# Patient Record
Sex: Female | Born: 2000 | Race: White | Hispanic: No | Marital: Single | Smoking: Never smoker
Health system: Southern US, Community
[De-identification: ages and names within clinical notes are randomized; demographics above are authoritative.]

## PROBLEM LIST (undated history)

## (undated) DIAGNOSIS — K219 Gastro-esophageal reflux disease without esophagitis: Secondary | ICD-10-CM

## (undated) DIAGNOSIS — F5000 Anorexia nervosa, unspecified: Secondary | ICD-10-CM

## (undated) DIAGNOSIS — N83209 Unspecified ovarian cyst, unspecified side: Secondary | ICD-10-CM

## (undated) DIAGNOSIS — F32A Depression, unspecified: Secondary | ICD-10-CM

## (undated) DIAGNOSIS — F419 Anxiety disorder, unspecified: Secondary | ICD-10-CM

## (undated) HISTORY — PX: UPPER GASTROINTESTINAL ENDOSCOPY: SHX188

## (undated) HISTORY — DX: Gastro-esophageal reflux disease without esophagitis: K21.9

## (undated) HISTORY — DX: Anxiety disorder, unspecified: F41.9

## (undated) HISTORY — DX: Depression, unspecified: F32.A

---
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
## 2018-07-19 ENCOUNTER — Other Ambulatory Visit: Payer: Self-pay

## 2018-07-19 ENCOUNTER — Encounter (HOSPITAL_BASED_OUTPATIENT_CLINIC_OR_DEPARTMENT_OTHER): Payer: Self-pay | Admitting: *Deleted

## 2018-07-19 NOTE — Progress Notes (Signed)
Your procedure is scheduled on  ____02-12-2018    Report to [facility] AT  5:30 AM   Call this number if you have problems the morning of surgery  :[phone].   OUR ADDRESS IS [address].  WE ARE LOCATED IN [facility].                                     REMEMBER:  No solid food after midnight the night prior to surgery.  Nothing by mouth exception clear liquid diet until 3 hours prior to scheduled surgery time, 4:30 AM.  Please finish ensure pre-surgery carbohydrate drink per your surgeon's order.  Needs to be completed at 4:30AM.  After this time nothing by mouth including candy, gum, mints.  Only sip of water with birth control pill.  __________________________________                                     DO NOT WEAR JEWERLY, MAKE UP, OR NAIL POLISH,   DO NOT WEAR LOTIONS, POWDERS, PERFUMES OR OILS  DO NOT SHAVE FOR 24 HOURS PRIOR TO DAY OF SURGERY.  CONTACTS, GLASSES, OR DENTURES MAY NOT BE WORN TO SURGERY.                                    [facility] IS NOT RESPONSIBLE  FOR ANY BELONGINGS.                                   __________________________________    CLEAR LIQUID DIET   Foods Allowed                                                                     Foods Excluded  Coffee and tea, regular and decaf                             liquids that you cannot  Plain Jell-O in any flavor                                             see through such as: Fruit ices (not with fruit pulp)                                     milk, soups, orange juice  Iced Popsicles                                    All solid food Carbonated beverages, regular and diet  Cranberry, grape and apple juices Sports drinks like Gatorade Lightly seasoned clear broth or consume(fat free) Sugar, honey syrup  _____________________________________________________________________    .

## 2018-07-19 NOTE — Progress Notes (Signed)
Spoke w/ pt via phone for pre-op interview.  Npo after mn w/ exception clear liquids until 0430 at which is to complete ensure pre-surgery drink (pt to pick up when she gets her lab work done ).  Needs urine preg.  Getting cbc and t&s done Thursday 07-22-2018 @ 0900 and picking up drink with instructions printed and given.

## 2018-07-21 NOTE — H&P (Addendum)
Alexis Keller is an 18 y.o. female. Presenting for scheduled LSC ovarian cyst drainage/removal and possible RSO. 7cm ovarian cyst found on OSH imaging in addition to large amount of FF. Patient with RLQ pain that is persistent - ibuprofen and tramadol help. She was switched from loloestrin to 19mcgEE/15mcg levonorgestrel in the office to help prevent future cust formation.  [place] repeat in office and found to have a persistent 6cm cyst and ff.  Pertinent Gynecological History: Menses: flow is moderate Bleeding: normal periods Contraception: OCP (estrogen/progesterone) DES exposure: denies Blood transfusions: none Sexually transmitted diseases: no past history Previous GYN Procedures: none  Last mammogram: N/A Last pap: N/A OB History: G0   Menstrual History: Patient's last menstrual period was 07/13/2018 (exact date).    Past Medical History:  Diagnosis Date  . Anorexia nervosa in remission   . Ovarian cyst    Depression/anxiety - sertraline 100mg  and trazadone prn sleep [facility]  Past Surgical History:  Procedure Laterality Date  . TONSILLECTOMY AND ADENOIDECTOMY  2008    Family History  Problem Relation Age of Onset  . Hypertension Other   . Stroke Other   . Diabetes Other   . Hyperlipidemia Other     Social History:  reports that she has never smoked. She has never used smokeless tobacco. She reports current alcohol use. She reports that she does not use drugs.  Allergies: No Known Allergies  No medications prior to admission.    ROS  Height 5\' 7"  (1.702 m), weight 64.9 kg, last menstrual period 07/13/2018. Physical Exam Gen: well appearing, NAD CV: Reg rate Pulm: NWOB Abd: soft, nondistended, nontender, no masses GYN: uterus normal sized, mild TTP R adnexa, + fullness, no overt CMT Ext: No edema b/l  No results found for this or any previous visit (from the past 24 hour(s)).  No results found.  Assessment/Plan: 18 yo G0 presenting for management of right  sided ovarian cyst. While it is large in size, it is simple appearing and I have a low suspicion for carcinogenic process. Options reviewed in office. She desires surgical management give + pain and >6cm. Plan for [facility] ovarian cystectomy, possible drainage of cyst, possible RSO, possible laparotomy, and any additional procedures.  Risks discussed including infection, bleeding, damage to surrounding structures, need for additional procedures, spreading of malignancy if cyst ruptures and is malignant, postoperative DVT and recurrent cyst formation. All questions answered. Consent signed in clinic.   Hx anxiety/depression c/w sertraline an dtrazadone. Also hx of eating d/o in remission.  On OCPs for [facility] - Plan to continue preop and postop - benefits outweigh risks.   Alexis Keller 07/21/2018, 12:04 PM   No updates to above H&P. Patient arrived NPO and was consented in PACU. Risks again discussed, all questions answered, and consent signed. Proceed with above surgery.    Belva Agee MD

## 2018-07-22 ENCOUNTER — Encounter (HOSPITAL_COMMUNITY)
Admission: RE | Admit: 2018-07-22 | Discharge: 2018-07-22 | Disposition: A | Discharge status: Home / Self Care | Payer: BLUE CROSS/BLUE SHIELD | Source: Ambulatory Visit | Attending: Obstetrics and Gynecology | Admitting: Obstetrics and Gynecology

## 2018-07-22 DIAGNOSIS — Z01812 Encounter for preprocedural laboratory examination: Secondary | ICD-10-CM | POA: Insufficient documentation

## 2018-07-22 LAB — CBC
HEMATOCRIT: 41.5 % (ref 36.0–46.0)
Hemoglobin: 13.5 g/dL (ref 12.0–15.0)
MCH: 28.8 pg (ref 26.0–34.0)
MCHC: 32.5 g/dL (ref 30.0–36.0)
MCV: 88.7 fL (ref 80.0–100.0)
NRBC: 0 % (ref 0.0–0.2)
Platelets: 195 10*3/uL (ref 150–400)
RBC: 4.68 MIL/uL (ref 3.87–5.11)
RDW: 12.3 % (ref 11.5–15.5)
WBC: 3.9 10*3/uL — ABNORMAL LOW (ref 4.0–10.5)

## 2018-07-22 LAB — ABO/RH: ABO/RH(D): O POS

## 2018-07-22 NOTE — Anesthesia Preprocedure Evaluation (Addendum)
Anesthesia Evaluation  Patient identified by MRN, date of birth, ID band Patient awake    Reviewed: Allergy & Precautions, NPO status , Patient's Chart, lab work & pertinent test results  Airway Mallampati: I  TM Distance: >3 FB Neck ROM: Full    Dental no notable dental hx. (+) Teeth Intact, Dental Advisory Given   Pulmonary neg pulmonary ROS,    Pulmonary exam normal breath sounds clear to auscultation       Cardiovascular Exercise Tolerance: Good negative cardio ROS Normal cardiovascular exam Rhythm:Regular Rate:Normal     Neuro/Psych negative neurological ROS     GI/Hepatic   Endo/Other    Renal/GU      Musculoskeletal   Abdominal   Peds  Hematology   Anesthesia Other Findings   Reproductive/Obstetrics negative OB ROS                           Anesthesia Physical Anesthesia Plan  ASA: I  Anesthesia Plan: General   Post-op Pain Management:    Induction: Intravenous  PONV Risk Score and Plan: 3 and Treatment may vary due to age or medical condition, Dexamethasone and Ondansetron  Airway Management Planned: Oral ETT  Additional Equipment:   Intra-op Plan:   Post-operative Plan: Extubation in OR  Informed Consent: I have reviewed the patients History and Physical, chart, labs and discussed the procedure including the risks, benefits and alternatives for the proposed anesthesia with the patient or authorized representative who has indicated his/her understanding and acceptance.     Dental advisory given  Plan Discussed with:   Anesthesia Plan Comments: (Lap ovarian Cystectomy)        Anesthesia Quick Evaluation

## 2018-07-23 ENCOUNTER — Ambulatory Visit (HOSPITAL_BASED_OUTPATIENT_CLINIC_OR_DEPARTMENT_OTHER)
Admission: RE | Admit: 2018-07-23 | Discharge: 2018-07-23 | Disposition: A | Discharge status: Home / Self Care | Payer: BLUE CROSS/BLUE SHIELD | Attending: Obstetrics and Gynecology | Admitting: Obstetrics and Gynecology

## 2018-07-23 ENCOUNTER — Encounter (HOSPITAL_BASED_OUTPATIENT_CLINIC_OR_DEPARTMENT_OTHER)
Admission: RE | Disposition: A | Discharge status: Home / Self Care | Payer: Self-pay | Source: Home / Self Care | Attending: Obstetrics and Gynecology

## 2018-07-23 ENCOUNTER — Ambulatory Visit (HOSPITAL_BASED_OUTPATIENT_CLINIC_OR_DEPARTMENT_OTHER): Payer: BLUE CROSS/BLUE SHIELD | Admitting: Anesthesiology

## 2018-07-23 ENCOUNTER — Encounter (HOSPITAL_BASED_OUTPATIENT_CLINIC_OR_DEPARTMENT_OTHER): Payer: Self-pay

## 2018-07-23 DIAGNOSIS — Z793 Long term (current) use of hormonal contraceptives: Secondary | ICD-10-CM | POA: Diagnosis not present

## 2018-07-23 DIAGNOSIS — N8301 Follicular cyst of right ovary: Secondary | ICD-10-CM | POA: Insufficient documentation

## 2018-07-23 DIAGNOSIS — F419 Anxiety disorder, unspecified: Secondary | ICD-10-CM | POA: Diagnosis not present

## 2018-07-23 DIAGNOSIS — F329 Major depressive disorder, single episode, unspecified: Secondary | ICD-10-CM | POA: Diagnosis not present

## 2018-07-23 DIAGNOSIS — Z79899 Other long term (current) drug therapy: Secondary | ICD-10-CM | POA: Insufficient documentation

## 2018-07-23 DIAGNOSIS — N83201 Unspecified ovarian cyst, right side: Secondary | ICD-10-CM | POA: Diagnosis present

## 2018-07-23 HISTORY — DX: Unspecified ovarian cyst, unspecified side: N83.209

## 2018-07-23 HISTORY — PX: LAPAROSCOPIC OVARIAN CYSTECTOMY: SHX6248

## 2018-07-23 HISTORY — DX: Anorexia nervosa, unspecified: F50.00

## 2018-07-23 LAB — TYPE AND SCREEN
ABO/RH(D): O POS
Antibody Screen: NEGATIVE

## 2018-07-23 LAB — POCT PREGNANCY, URINE: Preg Test, Ur: NEGATIVE

## 2018-07-23 SURGERY — EXCISION, CYST, OVARY, LAPAROSCOPIC
Anesthesia: General | Site: Abdomen | Laterality: Right

## 2018-07-23 MED ORDER — ONDANSETRON HCL 4 MG/2ML IJ SOLN
INTRAMUSCULAR | Status: AC
  Filled 2018-07-23: qty 2

## 2018-07-23 MED ORDER — SCOPOLAMINE 1 MG/3DAYS TD PT72
MEDICATED_PATCH | TRANSDERMAL | Status: AC
  Filled 2018-07-23: qty 1

## 2018-07-23 MED ORDER — TRAMADOL HCL 50 MG PO TABS: 50.0000 mg | ORAL_TABLET | Freq: Four times a day (QID) | ORAL | 0 refills | Status: AC | PRN

## 2018-07-23 MED ORDER — LACTATED RINGERS IV SOLN
INTRAVENOUS | Status: DC
  Administered 2018-07-23 (×2): via INTRAVENOUS
  Filled 2018-07-23: qty 1000

## 2018-07-23 MED ORDER — LIDOCAINE 2% (20 MG/ML) 5 ML SYRINGE
INTRAMUSCULAR | Status: DC | PRN
  Administered 2018-07-23: 100 mg via INTRAVENOUS

## 2018-07-23 MED ORDER — ACETAMINOPHEN 500 MG PO TABS
1000.0000 mg | ORAL_TABLET | Freq: Once | ORAL | Status: AC
  Administered 2018-07-23: 1000 mg via ORAL
  Filled 2018-07-23: qty 2

## 2018-07-23 MED ORDER — SUGAMMADEX SODIUM 200 MG/2ML IV SOLN
INTRAVENOUS | Status: AC
  Filled 2018-07-23: qty 2

## 2018-07-23 MED ORDER — FENTANYL CITRATE (PF) 100 MCG/2ML IJ SOLN
INTRAMUSCULAR | Status: DC | PRN
  Administered 2018-07-23: 25 ug via INTRAVENOUS
  Administered 2018-07-23 (×2): 50 ug via INTRAVENOUS
  Administered 2018-07-23 (×3): 25 ug via INTRAVENOUS

## 2018-07-23 MED ORDER — GLYCOPYRROLATE PF 0.2 MG/ML IJ SOSY
PREFILLED_SYRINGE | INTRAMUSCULAR | Status: AC
  Filled 2018-07-23: qty 1

## 2018-07-23 MED ORDER — MIDAZOLAM HCL 2 MG/2ML IJ SOLN
INTRAMUSCULAR | Status: AC
  Filled 2018-07-23: qty 2

## 2018-07-23 MED ORDER — OXYCODONE HCL 5 MG/5ML PO SOLN
5.0000 mg | Freq: Once | ORAL | Status: AC | PRN
  Filled 2018-07-23: qty 5

## 2018-07-23 MED ORDER — ROCURONIUM BROMIDE 100 MG/10ML IV SOLN
INTRAVENOUS | Status: AC
  Filled 2018-07-23: qty 1

## 2018-07-23 MED ORDER — PROPOFOL 10 MG/ML IV BOLUS
INTRAVENOUS | Status: AC
  Filled 2018-07-23: qty 20

## 2018-07-23 MED ORDER — GLYCOPYRROLATE PF 0.2 MG/ML IJ SOSY
PREFILLED_SYRINGE | INTRAMUSCULAR | Status: DC | PRN
  Administered 2018-07-23: .2 mg via INTRAVENOUS

## 2018-07-23 MED ORDER — GABAPENTIN 300 MG PO CAPS
ORAL_CAPSULE | ORAL | Status: AC
  Filled 2018-07-23: qty 1

## 2018-07-23 MED ORDER — BUPIVACAINE HCL (PF) 0.25 % IJ SOLN
INTRAMUSCULAR | Status: DC | PRN
  Administered 2018-07-23: 10 mL

## 2018-07-23 MED ORDER — ONDANSETRON HCL 4 MG/2ML IJ SOLN
4.0000 mg | Freq: Once | INTRAMUSCULAR | Status: DC | PRN
  Filled 2018-07-23: qty 2

## 2018-07-23 MED ORDER — DEXAMETHASONE SODIUM PHOSPHATE 10 MG/ML IJ SOLN
INTRAMUSCULAR | Status: DC | PRN
  Administered 2018-07-23: 5 mg via INTRAVENOUS

## 2018-07-23 MED ORDER — CELECOXIB 200 MG PO CAPS
ORAL_CAPSULE | ORAL | Status: AC
  Filled 2018-07-23: qty 2

## 2018-07-23 MED ORDER — MIDAZOLAM HCL 2 MG/2ML IJ SOLN
INTRAMUSCULAR | Status: DC | PRN
  Administered 2018-07-23: 2 mg via INTRAVENOUS

## 2018-07-23 MED ORDER — PROPOFOL 10 MG/ML IV BOLUS
INTRAVENOUS | Status: DC | PRN
  Administered 2018-07-23: 160 mg via INTRAVENOUS

## 2018-07-23 MED ORDER — ACETAMINOPHEN 500 MG PO TABS
ORAL_TABLET | ORAL | Status: AC
  Filled 2018-07-23: qty 2

## 2018-07-23 MED ORDER — SODIUM CHLORIDE 0.9 % IR SOLN
Status: DC | PRN
  Administered 2018-07-23: 1

## 2018-07-23 MED ORDER — OXYCODONE HCL 5 MG PO TABS
5.0000 mg | ORAL_TABLET | Freq: Once | ORAL | Status: AC | PRN
  Administered 2018-07-23: 5 mg via ORAL
  Filled 2018-07-23: qty 1

## 2018-07-23 MED ORDER — FENTANYL CITRATE (PF) 100 MCG/2ML IJ SOLN
INTRAMUSCULAR | Status: AC
  Filled 2018-07-23: qty 2

## 2018-07-23 MED ORDER — OXYCODONE HCL 5 MG PO TABS
ORAL_TABLET | ORAL | Status: AC
  Filled 2018-07-23: qty 1

## 2018-07-23 MED ORDER — HYDROMORPHONE HCL 1 MG/ML IJ SOLN
0.2500 mg | INTRAMUSCULAR | Status: DC | PRN
  Filled 2018-07-23: qty 0.5

## 2018-07-23 MED ORDER — DEXAMETHASONE SODIUM PHOSPHATE 10 MG/ML IJ SOLN
INTRAMUSCULAR | Status: AC
  Filled 2018-07-23: qty 1

## 2018-07-23 MED ORDER — KETOROLAC TROMETHAMINE 30 MG/ML IJ SOLN
30.0000 mg | Freq: Once | INTRAMUSCULAR | Status: DC | PRN
  Filled 2018-07-23: qty 1

## 2018-07-23 MED ORDER — SCOPOLAMINE 1 MG/3DAYS TD PT72
1.0000 | MEDICATED_PATCH | TRANSDERMAL | Status: DC
  Administered 2018-07-23: 1.5 mg via TRANSDERMAL
  Filled 2018-07-23: qty 1

## 2018-07-23 MED ORDER — MEPERIDINE HCL 25 MG/ML IJ SOLN
6.2500 mg | INTRAMUSCULAR | Status: DC | PRN
  Filled 2018-07-23: qty 1

## 2018-07-23 MED ORDER — GABAPENTIN 300 MG PO CAPS
300.0000 mg | ORAL_CAPSULE | Freq: Once | ORAL | Status: AC
  Administered 2018-07-23: 300 mg via ORAL
  Filled 2018-07-23: qty 1

## 2018-07-23 MED ORDER — ONDANSETRON HCL 4 MG/2ML IJ SOLN
INTRAMUSCULAR | Status: DC | PRN
  Administered 2018-07-23: 4 mg via INTRAVENOUS

## 2018-07-23 MED ORDER — LIDOCAINE 2% (20 MG/ML) 5 ML SYRINGE
INTRAMUSCULAR | Status: AC
  Filled 2018-07-23: qty 5

## 2018-07-23 MED ORDER — CELECOXIB 400 MG PO CAPS
400.0000 mg | ORAL_CAPSULE | Freq: Once | ORAL | Status: AC
  Administered 2018-07-23: 400 mg via ORAL
  Filled 2018-07-23: qty 1

## 2018-07-23 MED ORDER — SUGAMMADEX SODIUM 200 MG/2ML IV SOLN
INTRAVENOUS | Status: DC | PRN
  Administered 2018-07-23: 130 mg via INTRAVENOUS

## 2018-07-23 MED ORDER — ROCURONIUM BROMIDE 10 MG/ML (PF) SYRINGE
PREFILLED_SYRINGE | INTRAVENOUS | Status: DC | PRN
  Administered 2018-07-23: 35 mg via INTRAVENOUS
  Administered 2018-07-23: 15 mg via INTRAVENOUS

## 2018-07-23 MED ORDER — IBUPROFEN 800 MG PO TABS: 800.0000 mg | ORAL_TABLET | Freq: Three times a day (TID) | ORAL | 0 refills | Status: AC | PRN

## 2018-07-23 MED ORDER — KETOROLAC TROMETHAMINE 30 MG/ML IJ SOLN
INTRAMUSCULAR | Status: AC
  Filled 2018-07-23: qty 1

## 2018-07-23 SURGICAL SUPPLY — 35 items
CABLE HIGH FREQUENCY MONO STRZ (ELECTRODE) IMPLANT
CATH ROBINSON RED A/P 16FR (CATHETERS) ×2 IMPLANT
COVER MAYO STAND STRL (DRAPES) ×2 IMPLANT
COVER WAND RF STERILE (DRAPES) ×2 IMPLANT
DERMABOND ADVANCED (GAUZE/BANDAGES/DRESSINGS) ×1
DERMABOND ADVANCED .7 DNX12 (GAUZE/BANDAGES/DRESSINGS) ×1 IMPLANT
DRSG OPSITE POSTOP 3X4 (GAUZE/BANDAGES/DRESSINGS) IMPLANT
DURAPREP 26ML APPLICATOR (WOUND CARE) ×2 IMPLANT
GAUZE 4X4 16PLY RFD (DISPOSABLE) ×2 IMPLANT
GLOVE BIO SURGEON STRL SZ 6.5 (GLOVE) ×2 IMPLANT
GLOVE BIOGEL PI IND STRL 6.5 (GLOVE) ×1 IMPLANT
GLOVE BIOGEL PI INDICATOR 6.5 (GLOVE) ×1
GOWN STRL REUS W/TWL LRG LVL3 (GOWN DISPOSABLE) ×4 IMPLANT
IV NS 1000ML (IV SOLUTION)
IV NS 1000ML BAXH (IV SOLUTION) IMPLANT
LIGASURE BLUNT 5MM 37CM (INSTRUMENTS) IMPLANT
NDL HYPO 25X1 1.5 SAFETY (NEEDLE) ×1 IMPLANT
NEEDLE HYPO 25X1 1.5 SAFETY (NEEDLE) ×2 IMPLANT
NEEDLE INSUFFLATION 120MM (ENDOMECHANICALS) ×2 IMPLANT
NS IRRIG 500ML POUR BTL (IV SOLUTION) ×2 IMPLANT
PACK LAPAROSCOPY BASIN (CUSTOM PROCEDURE TRAY) ×2 IMPLANT
SCISSORS LAP 5X35 DISP (ENDOMECHANICALS) IMPLANT
SCISSORS LAP 5X45 EPIX DISP (ENDOMECHANICALS) IMPLANT
SET IRRIG TUBING LAPAROSCOPIC (IRRIGATION / IRRIGATOR) IMPLANT
SUT VICRYL 0 UR6 27IN ABS (SUTURE) ×2 IMPLANT
SUT VICRYL RAPIDE 4/0 PS 2 (SUTURE) ×4 IMPLANT
SYR 50ML LL SCALE MARK (SYRINGE) IMPLANT
SYS BAG RETRIEVAL 10MM (BASKET)
SYSTEM BAG RETRIEVAL 10MM (BASKET) IMPLANT
TOWEL OR 17X24 6PK STRL BLUE (TOWEL DISPOSABLE) ×4 IMPLANT
TROCAR XCEL NON-BLD 11X100MML (ENDOMECHANICALS) IMPLANT
TROCAR XCEL NON-BLD 5MMX100MML (ENDOMECHANICALS) ×2 IMPLANT
TUBING INSUF HEATED (TUBING) ×2 IMPLANT
WARMER LAPAROSCOPE (MISCELLANEOUS) ×2 IMPLANT
WATER STERILE IRR 500ML POUR (IV SOLUTION) ×2 IMPLANT

## 2018-07-23 NOTE — Anesthesia Postprocedure Evaluation (Signed)
Anesthesia Post Note  Patient: Alexis Keller  Procedure(s) Performed: LAPAROSCOPIC OVARIAN CYSTECTOMY (Right Abdomen)     Patient location during evaluation: PACU Anesthesia Type: General Level of consciousness: awake and alert Pain management: pain level controlled Vital Signs Assessment: post-procedure vital signs reviewed and stable Respiratory status: spontaneous breathing, nonlabored ventilation, respiratory function stable and patient connected to nasal cannula oxygen Cardiovascular status: blood pressure returned to baseline and stable Postop Assessment: no apparent nausea or vomiting Anesthetic complications: no    Last Vitals:  Vitals:   07/23/18 0900 07/23/18 0915  BP: 129/72 115/69  Pulse: 63 72  Resp: 10 (!) 21  Temp:    SpO2: 96% 97%    Last Pain:  Vitals:   07/23/18 0915  TempSrc:   PainSc: Asleep                 Trevor Iha

## 2018-07-23 NOTE — Brief Op Note (Signed)
07/23/2018  9:56 AM  PATIENT:  Alexis Keller  18 y.o. female  PRE-OPERATIVE DIAGNOSIS:  7cm RIGHT  ovarian cyst  POST-OPERATIVE DIAGNOSIS:  7cm RIGHT  ovarian cyst  PROCEDURE:  Procedure(s): LAPAROSCOPIC OVARIAN CYSTECTOMY (Right)  SURGEON:  Surgeon(s) and Role:    * Garyn Arlotta, Madelaine Etienne, MD - Primary  PHYSICIAN ASSISTANT:   ASSISTANTS: none   ANESTHESIA:   local and general  EBL:  20 mL   BLOOD ADMINISTERED:none  DRAINS: none   LOCAL MEDICATIONS USED:  LIDOCAINE  and Amount: 10 ml  SPECIMEN:  Source of Specimen:  ovarian cyst and pelvic washings  DISPOSITION OF SPECIMEN:  PATHOLOGY  COUNTS:  YES  TOURNIQUET:  * No tourniquets in log *  DICTATION: .Note written in EPIC  PLAN OF CARE: Discharge to home after PACU  PATIENT DISPOSITION:  PACU - hemodynamically stable.   Delay start of Pharmacological VTE agent (>24hrs) due to surgical blood loss or risk of bleeding: not applicable

## 2018-07-23 NOTE — Transfer of Care (Signed)
Immediate Anesthesia Transfer of Care Note  Patient: Alexis Keller  Procedure(s) Performed: Procedure(s) (LRB): LAPAROSCOPIC OVARIAN CYSTECTOMY (Right)  Patient Location: PACU  Anesthesia Type: General  Level of Consciousness: awake, oriented, sedated and patient cooperative  Airway & Oxygen Therapy: Patient Spontanous Breathing and Patient connected to face mask oxygen  Post-op Assessment: Report given to PACU RN and Post -op Vital signs reviewed and stable  Post vital signs: Reviewed and stable  Complications: No apparent anesthesia complications Last Vitals:  Vitals Value Taken Time  BP    Temp    Pulse 91 07/23/2018  8:51 AM  Resp    SpO2 97 % 07/23/2018  8:51 AM  Vitals shown include unvalidated device data.  Last Pain:  Vitals:   07/23/18 0601  TempSrc:   PainSc: 6       Patients Stated Pain Goal: 5 (07/23/18 0601)

## 2018-07-23 NOTE — Anesthesia Procedure Notes (Signed)
Procedure Name: Intubation Date/Time: 07/23/2018 7:24 AM Performed by: Suan Halter, CRNA Pre-anesthesia Checklist: Patient identified, Emergency Drugs available, Suction available and Patient being monitored Patient Re-evaluated:Patient Re-evaluated prior to induction Oxygen Delivery Method: Circle system utilized Preoxygenation: Pre-oxygenation with 100% oxygen Induction Type: IV induction Ventilation: Mask ventilation without difficulty Laryngoscope Size: Mac and 3 Grade View: Grade I Tube type: Oral Tube size: 7.0 mm Number of attempts: 1 Airway Equipment and Method: Stylet and Oral airway Placement Confirmation: ETT inserted through vocal cords under direct vision,  positive ETCO2 and breath sounds checked- equal and bilateral Secured at: 22 cm Tube secured with: Tape Dental Injury: Teeth and Oropharynx as per pre-operative assessment

## 2018-07-23 NOTE — Discharge Instructions (Signed)
DISCHARGE INSTRUCTIONS: Laparoscopy  The following instructions have been prepared to help you care for yourself upon your return home today.  Wound care: . Do not get the incision wet for the first 24 hours. The incision should be kept clean and dry. . The Band-Aids or dressings may be removed the day after surgery. . Should the incision become sore, red, and swollen after the first week, check with your doctor.  Personal hygiene: . Shower the day after your procedure.  Activity and limitations: . Do NOT drive or operate any equipment today. . Do NOT lift anything more than 15 pounds for 2-3 weeks after surgery. . Do NOT rest in bed all day. . Walking is encouraged. Walk each day, starting slowly with 5-minute walks 3 or 4 times a day. Slowly increase the length of your walks. . Walk up and down stairs slowly. . Do NOT do strenuous activities, such as golfing, playing tennis, bowling, running, biking, weight lifting, gardening, mowing, or vacuuming for 2-4 weeks. Ask your doctor when it is okay to start.  Diet: Eat a light meal as desired this evening. You may resume your usual diet tomorrow.  Return to work: This is dependent on the type of work you do. For the most part you can return to a desk job within a week of surgery. If you are more active at work, please discuss this with your doctor.  What to expect after your surgery: You may have a slight burning sensation when you urinate on the first day. You may have a very small amount of blood in the urine. Expect to have a small amount of vaginal discharge/light bleeding for 1-2 weeks. It is not unusual to have abdominal soreness and bruising for up to 2 weeks. You may be tired and need more rest for about 1 week. You may experience shoulder pain for 24-72 hours. Lying flat in bed may relieve it.  Call your doctor for any of the following: . Develop a fever of 100.4 or greater . Inability to urinate 6 hours after discharge from  hospital . Severe pain not relieved by pain medications . Persistent of heavy bleeding at incision site . Redness or swelling around incision site after a week . Increasing nausea or vomiting   Post Anesthesia Home Care Instructions  Activity: Get plenty of rest for the remainder of the day. A responsible adult should stay with you for 24 hours following the procedure.  For the next 24 hours, DO NOT: -Drive a car -Operate machinery -Drink alcoholic beverages -Take any medication unless instructed by your physician -Make any legal decisions or sign important papers.  Meals: Start with liquid foods such as gelatin or soup. Progress to regular foods as tolerated. Avoid greasy, spicy, heavy foods. If nausea and/or vomiting occur, drink only clear liquids until the nausea and/or vomiting subsides. Call your physician if vomiting continues.  Special Instructions/Symptoms: Your throat may feel dry or sore from the anesthesia or the breathing tube placed in your throat during surgery. If this causes discomfort, gargle with warm salt water. The discomfort should disappear within 24 hours.  If you had a scopolamine patch placed behind your ear for the management of post- operative nausea and/or vomiting:  1. The medication in the patch is effective for 72 hours, after which it should be removed.  Wrap patch in a tissue and discard in the trash. Wash hands thoroughly with soap and water. 2. You may remove the patch earlier than 72   hours if you experience unpleasant side effects which may include dry mouth, dizziness or visual disturbances. 3. Avoid touching the patch. Wash your hands with soap and water after contact with the patch.    

## 2018-07-23 NOTE — Op Note (Signed)
DATE OF OPERATION:  07/23/18  PREOPERATIVE DIAGNOSES: 1. Right ovarian cyst  POSTOPERATIVE DIAGNOSIS: 1. Same  OPERATION PERFORMED: Laparoscopic right sided ovarian cystectomy and pelvic washings  SURGEON:  Belva Agee, MD  ANESTHESIA:  General.  OPERATIVE FINDINGS: 1.  Bimanual examination revealed fullness in right adnexa and normal uterus 2. Operative findings showed   Normal uterus and bilateral fallopian tubes  Normal left ovary  Right ovary with large ~7cm ovarian cyst  Small amount of ff in culdesac  SPECIMEN SENT: Right ovarian cyst wall Pelvic washings  ESTIMATED BLOOD LOSS: 20 mL.  IV FLUIDS and URINE OUTPUT: See Anesthesia record   INDICATIONS FOR OPERATION: The patient is a 18 yo w/h/o R sided ovarian cyst (7cm) on [place] and ff with abdominal pain. All risks and benefits of the surgery had been discussed with the patient in the office and again on day of surgery. All her questions were answered. The patient was taken to the operating room in stable condition.  DESCRIPTION OF OPERATION: The patient was taken to the OR where she was placed under general anesthesia without difficulty. She was then prepped and draped in the usual sterile fashion and placed in the dorsal lithotomy position. A preoperative bimanual examination revealed findings as above. Attention was turned to the vagina, where a speculum was placed in the vagina and the anterior lip of the cervix was grasped using a single-toothed tenaculum. The manipulator was then inserted through the cervix without difficulty and the weighted speculum was removed. The bladder had been drained with a  Catheter. Attention was then turned to the abdomen where an approximately 11 mm skin incision was made at the base of the umbilicus. Entry into the peritoneum and insufflation was performed with a Veress needle to 3 liters of Co2 gas, drawing back on a syringe to ensure no blood or bowel contents were aspirated. Veress was  removed. At this time, the 10 mm trocar was placed through the umbilical incision and confirmation of intraabdominal placement was confirmed under direct visualization by the camera. At this time, the second incision was made using a 5 mm incision and 5 mm trocar 3cm above the pubic symphysis. Examination of the pelvis revealed findings as above. Pelvic washings were taken. Attention was turned to the right ovary which was mobilized out of the pelvis. A cyst was noted. An approximately 3 cm incision was made along the ovary using electrocautery. At this point, it was dissected initially using blunt dissection. Entry into the cyst was created using the electrocautery. Suction was used to fluid at this time from the cyst. At this time, the cyst wall was identified and was removed using blunt dissection with countertraction. Pieces of cyst wall were removed through the 73mm port.The ovarian bed was then irrigated and dried. There was slight oozing noted near the edge of the ovary, obtained hemostasis using electrocautery. The pelvis was then irrigated and hemostasis was achieved. Hemostasis was assured. At this time, all instruments were removed under visualization at low pressure. The 10 mm umbilical incision was closed using  One 0' Vicryl sutures and the skin was closed using a running stitch of 4-0 vicryl suture. 77mm port was closed with 4'0 and dermabond.  Attention was then turned to vagina were a the manipulator was removed. No bleeding from cervical os or tenaculum site. Once all instruments were removed, the patient was cleaned. The patient tolerated the procedure well without complications and was taken to the recovery room in stable condition.  Belva Agee MD

## 2018-07-26 ENCOUNTER — Encounter (HOSPITAL_BASED_OUTPATIENT_CLINIC_OR_DEPARTMENT_OTHER): Payer: Self-pay | Admitting: Obstetrics and Gynecology

## 2019-04-28 ENCOUNTER — Emergency Department (HOSPITAL_COMMUNITY)
Admission: EM | Admit: 2019-04-28 | Discharge: 2019-04-28 | Disposition: A | Discharge status: Home / Self Care | Payer: BC Managed Care – PPO | Attending: Emergency Medicine | Admitting: Emergency Medicine

## 2019-04-28 ENCOUNTER — Other Ambulatory Visit: Payer: Self-pay

## 2019-04-28 ENCOUNTER — Encounter (HOSPITAL_COMMUNITY): Payer: Self-pay | Admitting: Emergency Medicine

## 2019-04-28 ENCOUNTER — Emergency Department (HOSPITAL_COMMUNITY): Payer: BC Managed Care – PPO

## 2019-04-28 DIAGNOSIS — R1031 Right lower quadrant pain: Secondary | ICD-10-CM

## 2019-04-28 DIAGNOSIS — Z79899 Other long term (current) drug therapy: Secondary | ICD-10-CM | POA: Insufficient documentation

## 2019-04-28 LAB — CBC
HCT: 46.2 % — ABNORMAL HIGH (ref 36.0–46.0)
Hemoglobin: 15.2 g/dL — ABNORMAL HIGH (ref 12.0–15.0)
MCH: 28.9 pg (ref 26.0–34.0)
MCHC: 32.9 g/dL (ref 30.0–36.0)
MCV: 87.8 fL (ref 80.0–100.0)
Platelets: 248 10*3/uL (ref 150–400)
RBC: 5.26 MIL/uL — ABNORMAL HIGH (ref 3.87–5.11)
RDW: 12.6 % (ref 11.5–15.5)
WBC: 4.3 10*3/uL (ref 4.0–10.5)
nRBC: 0 % (ref 0.0–0.2)

## 2019-04-28 LAB — COMPREHENSIVE METABOLIC PANEL
ALT: 13 U/L (ref 0–44)
AST: 17 U/L (ref 15–41)
Albumin: 4.5 g/dL (ref 3.5–5.0)
Alkaline Phosphatase: 43 U/L (ref 38–126)
Anion gap: 11 (ref 5–15)
BUN: 9 mg/dL (ref 6–20)
CO2: 24 mmol/L (ref 22–32)
Calcium: 9.9 mg/dL (ref 8.9–10.3)
Chloride: 104 mmol/L (ref 98–111)
Creatinine, Ser: 0.67 mg/dL (ref 0.44–1.00)
GFR calc Af Amer: >60 mL/min (ref >60)
GFR calc non Af Amer: >60 mL/min (ref >60)
Glucose, Bld: 101 mg/dL — ABNORMAL HIGH (ref 70–99)
Potassium: 3.6 mmol/L (ref 3.5–5.1)
Sodium: 139 mmol/L (ref 135–145)
Total Bilirubin: 0.6 mg/dL (ref 0.3–1.2)
Total Protein: 7.4 g/dL (ref 6.5–8.1)

## 2019-04-28 LAB — URINALYSIS, ROUTINE W REFLEX MICROSCOPIC
Bilirubin Urine: NEGATIVE
Glucose, UA: NEGATIVE mg/dL
Hgb urine dipstick: NEGATIVE
Ketones, ur: NEGATIVE mg/dL
Leukocytes,Ua: NEGATIVE
Nitrite: NEGATIVE
Protein, ur: NEGATIVE mg/dL
Specific Gravity, Urine: 1.018 (ref 1.005–1.030)
pH: 7 (ref 5.0–8.0)

## 2019-04-28 LAB — I-STAT BETA HCG BLOOD, ED (MC, WL, AP ONLY): I-stat hCG, quantitative: <5 m[IU]/mL (ref <5)

## 2019-04-28 LAB — LIPASE, BLOOD: Lipase: 30 U/L (ref 11–51)

## 2019-04-28 MED ORDER — SODIUM CHLORIDE 0.9% FLUSH: 3.0000 mL | Freq: Once | INTRAVENOUS | Status: DC

## 2019-04-28 MED ORDER — DICYCLOMINE HCL 20 MG PO TABS: 20.0000 mg | ORAL_TABLET | Freq: Two times a day (BID) | ORAL | 0 refills | Status: DC

## 2019-04-28 MED ORDER — IOHEXOL 300 MG/ML  SOLN
100.0000 mL | Freq: Once | INTRAMUSCULAR | Status: AC | PRN
  Administered 2019-04-28: 100 mL via INTRAVENOUS

## 2019-04-28 NOTE — Discharge Instructions (Addendum)
As discussed, your evaluation today has been largely reassuring.  But, it is important that you monitor your condition carefully, and do not hesitate to return to the ED if you develop new, or concerning changes in your condition.  Otherwise, please follow-up with your physician for appropriate ongoing care.   Sorry for the delay in discharge

## 2019-04-28 NOTE — ED Notes (Signed)
Patient transported to CT 

## 2019-04-28 NOTE — ED Triage Notes (Signed)
Pt endorses RLQ abd pain for about a week that has worsened. Seen by OBGYN and cleared there. Endorses nausea.

## 2019-04-28 NOTE — ED Provider Notes (Signed)
[facility] EMERGENCY DEPARTMENT Provider Note   CSN: [id number] Arrival date & time: 04/28/19  1451     History   Chief Complaint Chief Complaint  Patient presents with  . Abdominal Pain    HPI Alexis Keller is a 18 y.o. female.     HPI Patient presents with her mother who assists with the HPI. Patient has a history of obstetric issues going back some time, including ovarian cyst surgery earlier this year. She notes that since about that time she has had intermittent discomfort, but now over the past days she has had worsening pain, primarily in the right lower quadrant. There is new, increasing anorexia, mild nausea. No fever.  The pain itself is sore, moderate, nonradiating. No new vaginal complaints, and the patient saw her gynecologist within the past 4 days, had unremarkable exam according to her and her mother, including ultrasound. With new right-sided lower abdominal pain, anorexia, she presents for evaluation.  Past Medical History:  Diagnosis Date  . Anorexia nervosa in remission   . Ovarian cyst     There are no active problems to display for this patient.   Past Surgical History:  Procedure Laterality Date  . LAPAROSCOPIC OVARIAN CYSTECTOMY Right 07/23/2018   Procedure: LAPAROSCOPIC OVARIAN CYSTECTOMY;  Surgeon: Ranae Pila, MD;  Location: [facility];  Service: Gynecology;  Laterality: Right;  . TONSILLECTOMY AND ADENOIDECTOMY  2008     OB History   No obstetric history on file.      Home Medications    Prior to Admission medications   Medication Sig Start Date End Date Taking? Authorizing Provider  Bioflavonoid Products (VITAMIN C) CHEW Chew 2 tablets by mouth daily.   Yes [provider]  hydrOXYzine (VISTARIL) 25 MG capsule Take 25-50 mg by mouth daily as needed for anxiety.  11/30/18  Yes [provider]  ibuprofen (ADVIL,MOTRIN) 800 MG tablet Take 1 tablet (800 mg total) by  mouth every 8 (eight) hours as needed. Patient taking differently: Take 800 mg by mouth daily.  07/23/18  Yes Ranae Pila, MD  LILLOW 0.15-30 MG-MCG tablet Take 1 tablet by mouth daily. 03/02/19  Yes [provider]  sertraline (ZOLOFT) 100 MG tablet Take 150 mg by mouth at bedtime. Pt takes one and one half tablets to = 150 mg   Yes [provider]  traZODone (DESYREL) 50 MG tablet Take 50 mg by mouth at bedtime.   Yes [provider]  dicyclomine (BENTYL) 20 MG tablet Take 1 tablet (20 mg total) by mouth 2 (two) times daily. 04/28/19   Gerhard Munch, MD    Family History Family History  Problem Relation Age of Onset  . Hypertension Other   . Stroke Other   . Diabetes Other   . Hyperlipidemia Other     Social History Social History   Tobacco Use  . Smoking status: Never Smoker  . Smokeless tobacco: Never Used  Substance Use Topics  . Alcohol use: Yes    Comment: occasional  . Drug use: Never     Allergies   Patient has no known allergies.   Review of Systems Review of Systems  Constitutional:       Per HPI, otherwise negative  HENT:       Per HPI, otherwise negative  Respiratory:       Per HPI, otherwise negative  Cardiovascular:       Per HPI, otherwise negative  Gastrointestinal: Negative for vomiting.  Inconsistent bowel movements  Endocrine:       Negative aside from HPI  Genitourinary:       Neg aside from HPI   Musculoskeletal:       Per HPI, otherwise negative  Skin: Negative.   Neurological: Negative for syncope.     Physical Exam Updated Vital Signs BP (!) 139/97   Pulse 90   Temp 98.8 F (37.1 C) (Oral)   Resp 16   Ht 5\' 7"  (1.702 m)   Wt 61.7 kg   LMP  (LMP Unknown)   SpO2 100%   BMI 21.30 kg/m   Physical Exam Vitals signs and nursing note reviewed.  Constitutional:      General: She is not in acute distress.    Appearance: She is well-developed.  HENT:     Head: Normocephalic and  atraumatic.  Eyes:     Conjunctiva/sclera: Conjunctivae normal.  Cardiovascular:     Rate and Rhythm: Normal rate and regular rhythm.  Pulmonary:     Effort: Pulmonary effort is normal. No respiratory distress.     Breath sounds: Normal breath sounds. No stridor.  Abdominal:     General: There is no distension.     Tenderness: There is abdominal tenderness in the right lower quadrant.  Skin:    General: Skin is warm and dry.  Neurological:     Mental Status: She is alert and oriented to person, place, and time.     Cranial Nerves: No cranial nerve deficit.      ED Treatments / Results  Labs (all labs ordered are listed, but only abnormal results are displayed) Labs Reviewed  COMPREHENSIVE METABOLIC PANEL - Abnormal; Notable for the following components:      Result Value   Glucose, Bld 101 (*)    All other components within normal limits  CBC - Abnormal; Notable for the following components:   RBC 5.26 (*)    Hemoglobin 15.2 (*)    HCT 46.2 (*)    All other components within normal limits  LIPASE, BLOOD  URINALYSIS, ROUTINE W REFLEX MICROSCOPIC  I-STAT BETA HCG BLOOD, ED ([facility], [facility], [facility] ONLY)    EKG None  Radiology Ct Abdomen Pelvis W Contrast  Result Date: 04/28/2019 CLINICAL DATA:  Right lower quadrant pain EXAM: CT ABDOMEN AND PELVIS WITH CONTRAST TECHNIQUE: Multidetector CT imaging of the abdomen and pelvis was performed using the standard protocol following bolus administration of intravenous contrast. CONTRAST:  100mL OMNIPAQUE IOHEXOL 300 MG/ML  SOLN COMPARISON:  None. FINDINGS: Lower chest: The visualized heart size within normal limits. No pericardial fluid/thickening. No hiatal hernia. The visualized portions of the lungs are clear. Hepatobiliary: The liver is normal in density without focal abnormality.The main portal vein is patent. No evidence of calcified gallstones, gallbladder wall thickening or biliary dilatation. Pancreas: Unremarkable. No pancreatic ductal  dilatation or surrounding inflammatory changes. Spleen: Normal in size without focal abnormality. Adrenals/Urinary Tract: Both adrenal glands appear normal. There is probable focal areas of scarring seen within the lower pole of the right kidney. The left kidney is unremarkable. No hydronephrosis. Bladder is unremarkable. Stomach/Bowel: The stomach, small bowel, and colon are normal in appearance. No inflammatory changes, wall thickening, or obstructive findings. Small to moderate amount colonic stool. The appendix is normal. Vascular/Lymphatic: There are no enlarged mesenteric, retroperitoneal, or pelvic lymph nodes. No significant vascular findings are present. Reproductive: The uterus and adnexa are unremarkable. Small amount free fluid seen within the cul-de-sac. Other: No evidence of abdominal wall  mass or hernia. Musculoskeletal: No acute or significant osseous findings. IMPRESSION: Normal appendix. Small to moderate amount of colonic stool without evidence of obstruction. Electronically Signed   By: Prudencio Pair M.D.   On: 04/28/2019 19:52    Procedures Procedures (including critical care time)  Medications Ordered in ED Medications  sodium chloride flush (NS) 0.9 % injection 3 mL (3 mLs Intravenous Not Given 04/28/19 1814)  iohexol (OMNIPAQUE) 300 MG/ML solution 100 mL (100 mLs Intravenous Contrast Given 04/28/19 1932)     Initial Impression / Assessment and Plan / ED Course  I have reviewed the triage vital signs and the nursing notes.  Pertinent labs & imaging results that were available during my care of the patient were reviewed by me and considered in my medical decision making (see chart for details).    On repeat exam patient is in no distress. I had a lengthy conversation about the patient's abdominal pain with her and her mother. With no evidence for acute appendicitis, recent unremarkable OB/GYN evaluation, there are some suspicion for either postoperative inflammation/scar  tissue versus GI etiology. Patient has an appointment but not for 1 month with gastroenterology.  She was provided referral to additional, will attempt Bentyl after conversation of the possibility of this improving her symptoms, continue stool softener, and as above, with unremarkable vital signs, reassuring evaluation here she was discharged in stable condition.   Final Clinical Impressions(s) / ED Diagnoses   Final diagnoses:  Right lower quadrant abdominal pain    ED Discharge Orders         Ordered    dicyclomine (BENTYL) 20 MG tablet  2 times daily     04/28/19 2153           Carmin Muskrat, MD 04/28/19 2157

## 2019-04-28 NOTE — ED Notes (Signed)
ED Provider at bedside. 

## 2021-09-24 IMAGING — CT CT ABD-PELV W/ CM
1 series · 15 of 32 positions shown, 19 images · IV contrast (omnipaque)
Comparison: None.

CLINICAL DATA: Right lower quadrant pain

EXAM:
CT ABDOMEN AND PELVIS WITH CONTRAST
TECHNIQUE: Multidetector CT imaging of the abdomen and pelvis was performed
using the standard protocol following bolus administration of
intravenous contrast.
CONTRAST:  100mL OMNIPAQUE IOHEXOL 300 MG/ML  SOLN

[Series 3: abdomen 5.0 · axial · 0.67mm/px · z∈[-459,-54]mm · 15 of 90 slices shown, 19 images]
[im 6/90  soft-tissue]
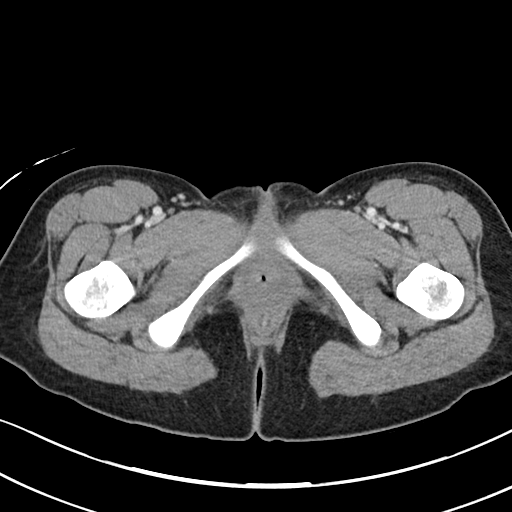
[im 6/90  bone]
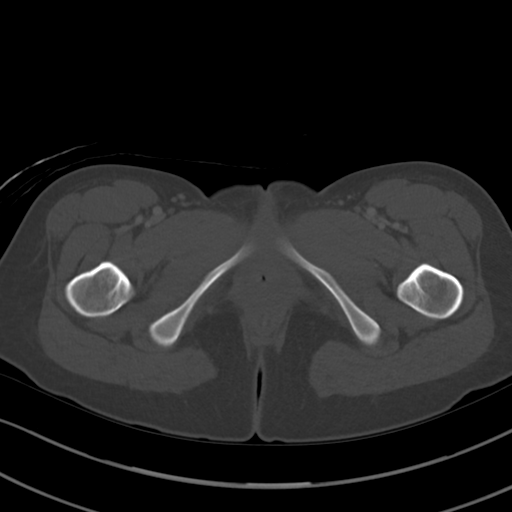
[im 12/90  soft-tissue]
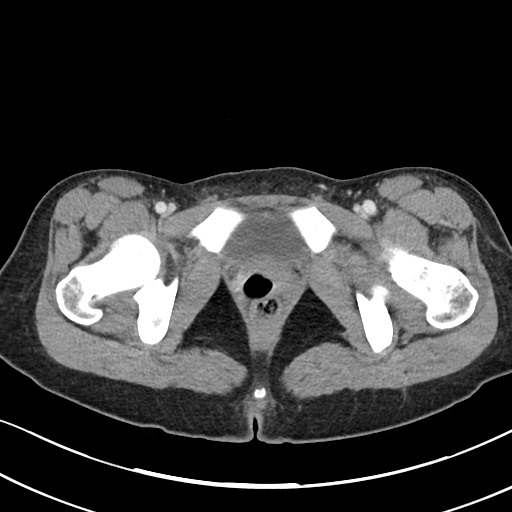
[im 18/90  soft-tissue]
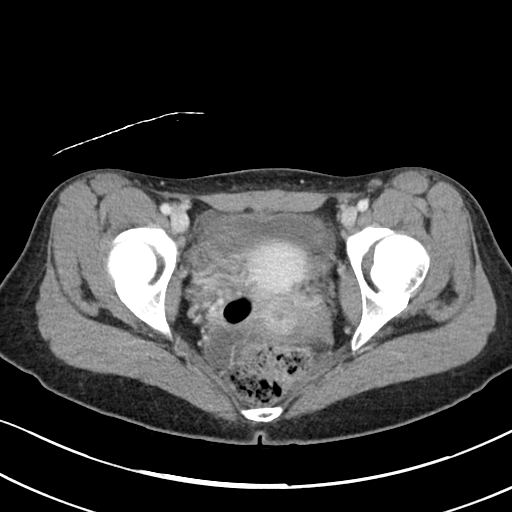
[im 26/90  soft-tissue]
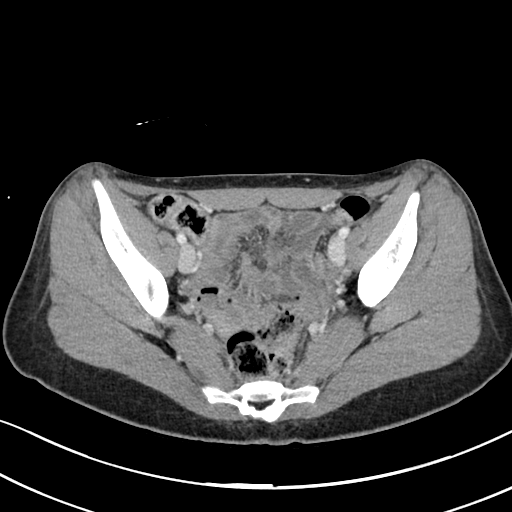
[im 32/90  soft-tissue]
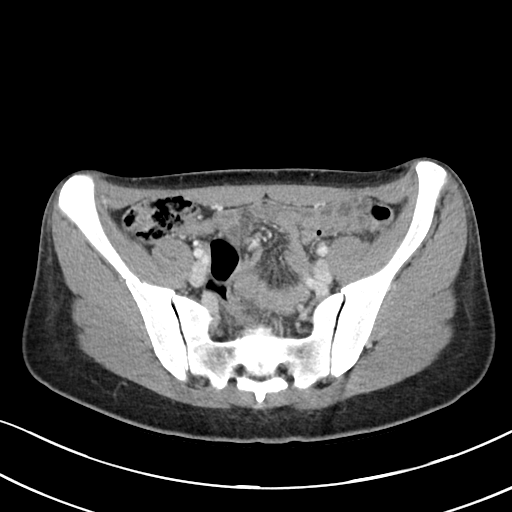
[im 38/90  soft-tissue]
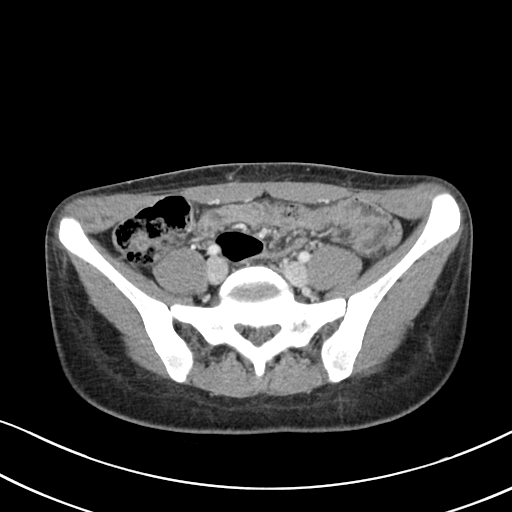
[im 46/90  soft-tissue]
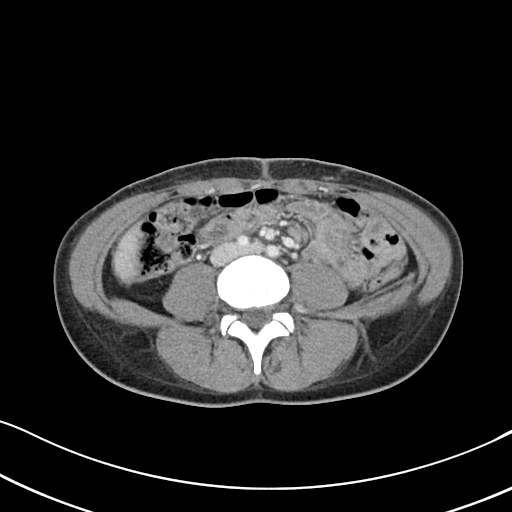
[im 52/90  soft-tissue]
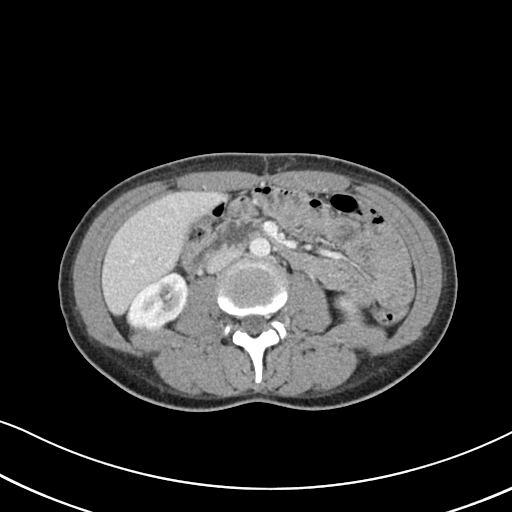
[im 58/90  soft-tissue]
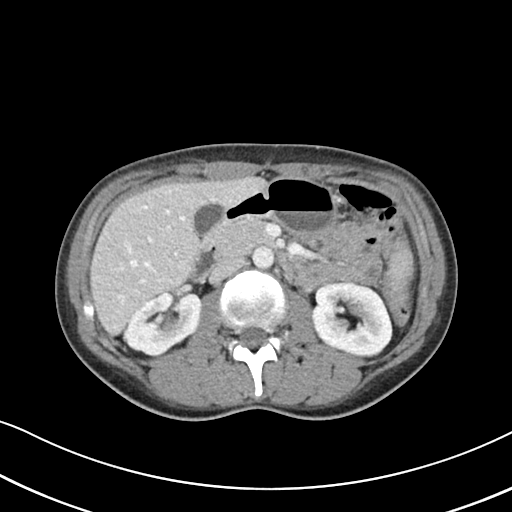
[im 58/90  bone]
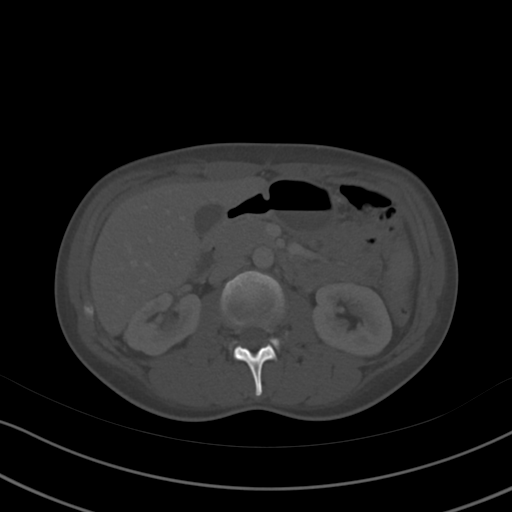
[im 64/90  soft-tissue]
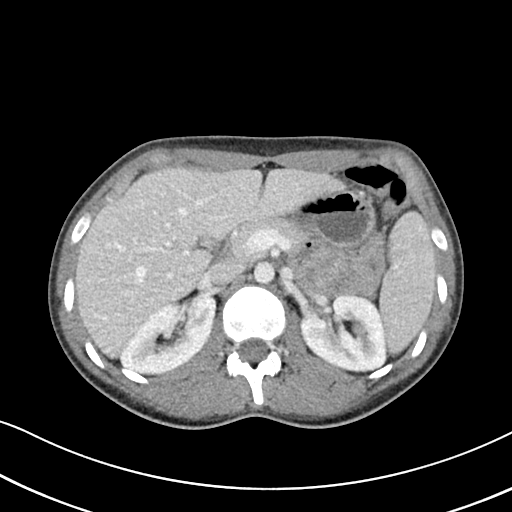
[im 72/90  soft-tissue]
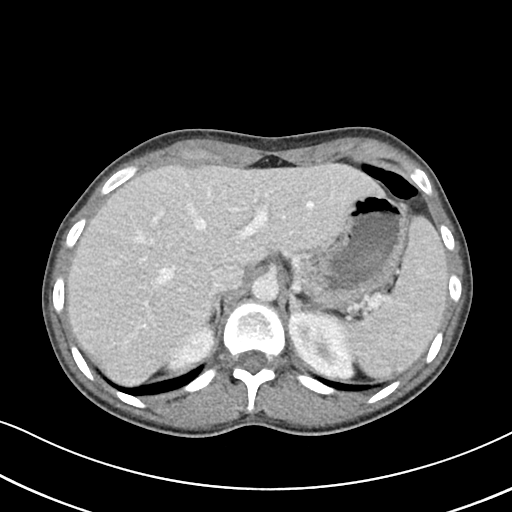
[im 78/90  soft-tissue]
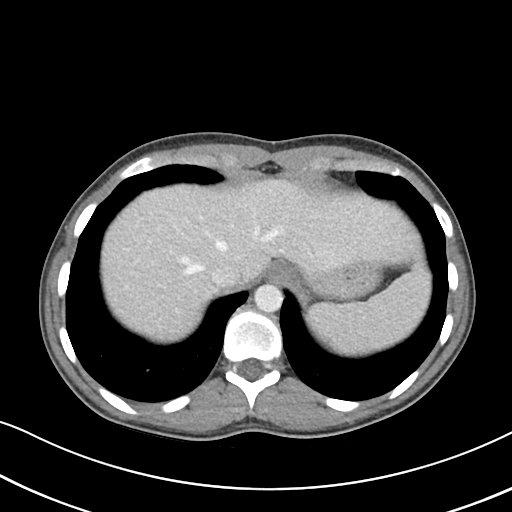
[im 78/90  lung]
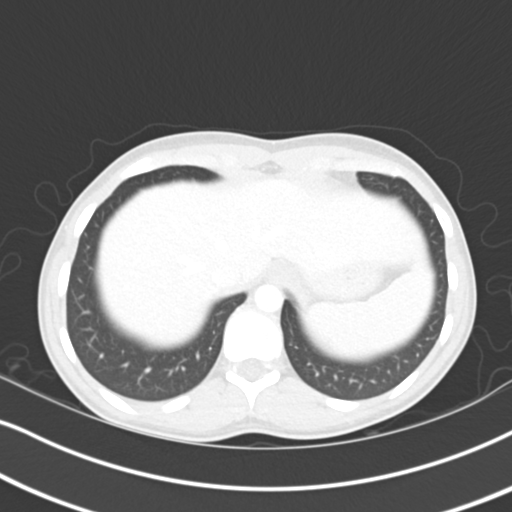
[im 81/90  lung]
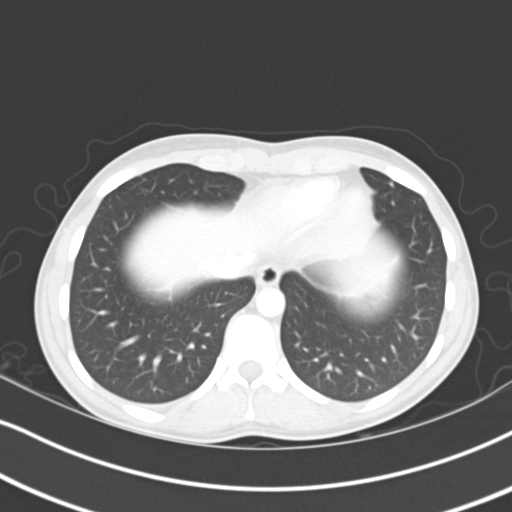
[im 84/90  soft-tissue]
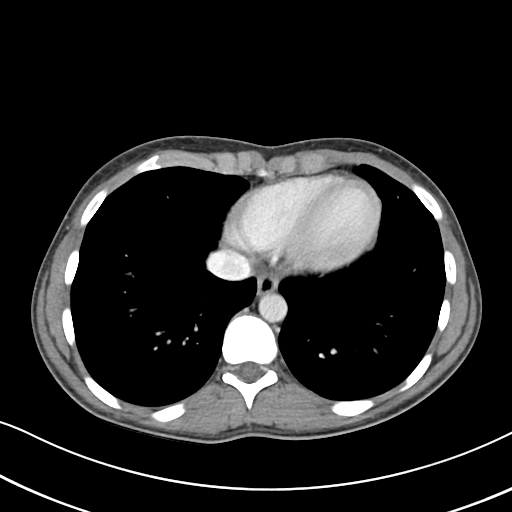
[im 84/90  lung]
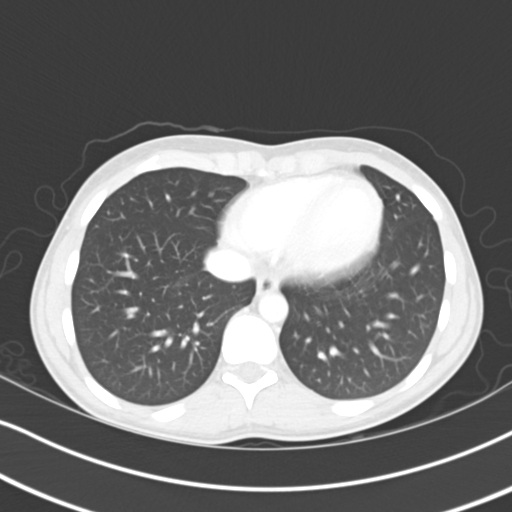
[im 87/90  lung]
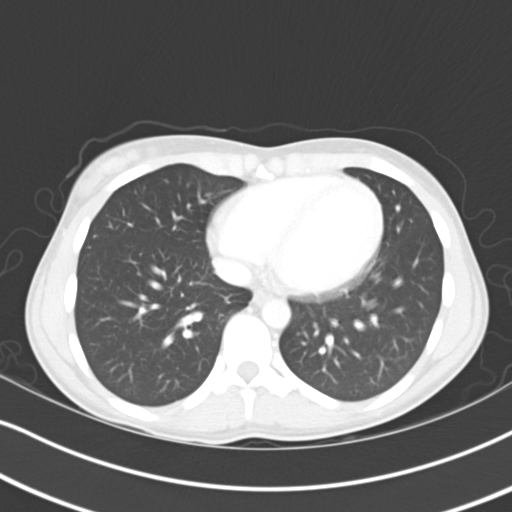

[15 of 32 positions shown; findings below may reference images not displayed]

FINDINGS: Lower chest: The visualized heart size within normal limits. No
pericardial fluid/thickening.

No hiatal hernia.

The visualized portions of the lungs are clear.

Hepatobiliary: The liver is normal in density without focal
abnormality.The main portal vein is patent. No evidence of calcified
gallstones, gallbladder wall thickening or biliary dilatation.

Pancreas: Unremarkable. No pancreatic ductal dilatation or
surrounding inflammatory changes.

Spleen: Normal in size without focal abnormality.

Adrenals/Urinary Tract: Both adrenal glands appear normal. There is
probable focal areas of scarring seen within the lower pole of the
right kidney. The left kidney is unremarkable. No hydronephrosis.
Bladder is unremarkable.

Stomach/Bowel: The stomach, small bowel, and colon are normal in
appearance. No inflammatory changes, wall thickening, or obstructive
findings. Small to moderate amount colonic stool. The appendix is
normal.

Vascular/Lymphatic: There are no enlarged mesenteric,
retroperitoneal, or pelvic lymph nodes. No significant vascular
findings are present.

Reproductive: The uterus and adnexa are unremarkable. Small amount
free fluid seen within the cul-de-sac.

Other: No evidence of abdominal wall mass or hernia.

Musculoskeletal: No acute or significant osseous findings.
IMPRESSION: Normal appendix.

Small to moderate amount of colonic stool without evidence of
obstruction.

## 2023-06-23 ENCOUNTER — Encounter (HOSPITAL_BASED_OUTPATIENT_CLINIC_OR_DEPARTMENT_OTHER): Payer: Self-pay | Admitting: Emergency Medicine

## 2023-06-23 ENCOUNTER — Other Ambulatory Visit: Payer: Self-pay

## 2023-06-23 ENCOUNTER — Emergency Department (HOSPITAL_BASED_OUTPATIENT_CLINIC_OR_DEPARTMENT_OTHER)
Admission: EM | Admit: 2023-06-23 | Discharge: 2023-06-23 | Disposition: A | Discharge status: Home / Self Care | Payer: BC Managed Care – PPO

## 2023-06-23 ENCOUNTER — Emergency Department (HOSPITAL_BASED_OUTPATIENT_CLINIC_OR_DEPARTMENT_OTHER): Payer: BC Managed Care – PPO

## 2023-06-23 DIAGNOSIS — R8289 Other abnormal findings on cytological and histological examination of urine: Secondary | ICD-10-CM | POA: Diagnosis not present

## 2023-06-23 DIAGNOSIS — R109 Unspecified abdominal pain: Secondary | ICD-10-CM

## 2023-06-23 DIAGNOSIS — R110 Nausea: Secondary | ICD-10-CM | POA: Diagnosis not present

## 2023-06-23 DIAGNOSIS — R42 Dizziness and giddiness: Secondary | ICD-10-CM | POA: Insufficient documentation

## 2023-06-23 DIAGNOSIS — R1033 Periumbilical pain: Secondary | ICD-10-CM | POA: Diagnosis present

## 2023-06-23 DIAGNOSIS — R1031 Right lower quadrant pain: Secondary | ICD-10-CM | POA: Diagnosis not present

## 2023-06-23 LAB — COMPREHENSIVE METABOLIC PANEL
ALT: 11 U/L (ref 0–44)
AST: 12 U/L — ABNORMAL LOW (ref 15–41)
Albumin: 4.8 g/dL (ref 3.5–5.0)
Alkaline Phosphatase: 34 U/L — ABNORMAL LOW (ref 38–126)
Anion gap: 9 (ref 5–15)
BUN: 12 mg/dL (ref 6–20)
CO2: 25 mmol/L (ref 22–32)
Calcium: 9.4 mg/dL (ref 8.9–10.3)
Chloride: 105 mmol/L (ref 98–111)
Creatinine, Ser: 0.62 mg/dL (ref 0.44–1.00)
GFR, Estimated: >60 mL/min (ref >60)
Glucose, Bld: 87 mg/dL (ref 70–99)
Potassium: 3.6 mmol/L (ref 3.5–5.1)
Sodium: 139 mmol/L (ref 135–145)
Total Bilirubin: 0.4 mg/dL (ref 0.0–1.2)
Total Protein: 7.3 g/dL (ref 6.5–8.1)

## 2023-06-23 LAB — CBC WITH DIFFERENTIAL/PLATELET
Abs Immature Granulocytes: 0.02 10*3/uL (ref 0.00–0.07)
Basophils Absolute: 0 10*3/uL (ref 0.0–0.1)
Basophils Relative: 0 %
Eosinophils Absolute: 0 10*3/uL (ref 0.0–0.5)
Eosinophils Relative: 1 %
HCT: 41.6 % (ref 36.0–46.0)
Hemoglobin: 14.2 g/dL (ref 12.0–15.0)
Immature Granulocytes: 0 %
Lymphocytes Relative: 27 %
Lymphs Abs: 1.6 10*3/uL (ref 0.7–4.0)
MCH: 28.7 pg (ref 26.0–34.0)
MCHC: 34.1 g/dL (ref 30.0–36.0)
MCV: 84 fL (ref 80.0–100.0)
Monocytes Absolute: 0.3 10*3/uL (ref 0.1–1.0)
Monocytes Relative: 5 %
Neutro Abs: 4 10*3/uL (ref 1.7–7.7)
Neutrophils Relative %: 67 %
Platelets: 204 10*3/uL (ref 150–400)
RBC: 4.95 MIL/uL (ref 3.87–5.11)
RDW: 12.1 % (ref 11.5–15.5)
WBC: 6 10*3/uL (ref 4.0–10.5)
nRBC: 0 % (ref 0.0–0.2)

## 2023-06-23 LAB — URINALYSIS, ROUTINE W REFLEX MICROSCOPIC
Bacteria, UA: NONE SEEN
Bilirubin Urine: NEGATIVE
Glucose, UA: NEGATIVE mg/dL
Hgb urine dipstick: NEGATIVE
Nitrite: NEGATIVE
Specific Gravity, Urine: 1.034 — ABNORMAL HIGH (ref 1.005–1.030)
pH: 6 (ref 5.0–8.0)

## 2023-06-23 LAB — PREGNANCY, URINE: Preg Test, Ur: NEGATIVE

## 2023-06-23 LAB — LIPASE, BLOOD: Lipase: 21 U/L (ref 11–51)

## 2023-06-23 MED ORDER — HYOSCYAMINE SULFATE 0.125 MG SL SUBL: 0.1250 mg | SUBLINGUAL_TABLET | SUBLINGUAL | 0 refills | Status: DC | PRN

## 2023-06-23 MED ORDER — ONDANSETRON HCL 4 MG PO TABS: 4.0000 mg | ORAL_TABLET | Freq: Four times a day (QID) | ORAL | 0 refills | Status: DC

## 2023-06-23 MED ORDER — MORPHINE SULFATE (PF) 2 MG/ML IV SOLN
2.0000 mg | Freq: Once | INTRAVENOUS | Status: AC
  Administered 2023-06-23: 2 mg via INTRAVENOUS
  Filled 2023-06-23: qty 1

## 2023-06-23 MED ORDER — ONDANSETRON HCL 4 MG/2ML IJ SOLN
4.0000 mg | Freq: Once | INTRAMUSCULAR | Status: AC
  Administered 2023-06-23: 4 mg via INTRAVENOUS
  Filled 2023-06-23: qty 2

## 2023-06-23 MED ORDER — ONDANSETRON HCL 4 MG PO TABS
4.0000 mg | ORAL_TABLET | Freq: Four times a day (QID) | ORAL | 0 refills | Status: DC
  Filled 2023-06-23: qty 12, 3d supply, fill #0

## 2023-06-23 MED ORDER — IOHEXOL 300 MG/ML  SOLN
100.0000 mL | Freq: Once | INTRAMUSCULAR | Status: AC | PRN
  Administered 2023-06-23: 80 mL via INTRAVENOUS

## 2023-06-23 MED ORDER — HYOSCYAMINE SULFATE 0.125 MG SL SUBL
0.1250 mg | SUBLINGUAL_TABLET | SUBLINGUAL | 0 refills | Status: DC | PRN
  Filled 2023-06-23: qty 30, 5d supply, fill #0

## 2023-06-23 NOTE — Discharge Instructions (Signed)
 Your workup today was reassuring.  Please take the Levsin  as needed for abdominal pain or cramping as well as Zofran  as needed for nausea.  Please call and schedule a follow-up appointment with the GI doctor at the number provided.  Return to the emergency department for worsening symptoms.

## 2023-06-23 NOTE — ED Triage Notes (Signed)
 C/o nausea and lower R quadrant pain. Intermittent constipation. Seen at [facility] and dx w/ diverticulitis but w/o imaging. Hx of ovarian cyst but had US performed today and states it was normal.

## 2023-06-23 NOTE — ED Provider Notes (Signed)
 [place] EMERGENCY DEPARTMENT AT [facility] Provider Note   CSN: [id number] Arrival date & time: 06/23/23  1303     History  Chief Complaint  Patient presents with   Abdominal Pain    Alexis Keller is a 23 y.o. female.  23 year old female with no reported past medical history presenting to the emergency department today with abdominal pain, nausea, and lightheadedness.  The patient states that this been going on now since last Monday.  She has been having intermittent symptoms since then.  She states that she went to urgent care initially and was diagnosed with diverticulitis and was prescribed Augmentin.  The patient has not had any imaging performed other than an ultrasound which was done prior to arrival.  She did follow-up with her OB/GYN today because she does have a history of ovarian cyst.  She had a favorable exam and ultrasound in their office.  She was subsequently sent to the ER for further evaluation.  The patient states the pain is mostly in the right periumbilical region right lower quadrant.  She states that she has been having some alternating diarrhea with constipation over the past week.   Abdominal Pain Associated symptoms: nausea        Home Medications Prior to Admission medications   Medication Sig Start Date End Date Taking? Authorizing Provider  amoxicillin-clavulanate (AUGMENTIN) 875-125 MG tablet Take 1 tablet by mouth 3 (three) times daily. 06/21/23 07/01/23 Yes [provider]  hyoscyamine  (LEVSIN AMIEL) 0.125 MG SL tablet Place 1 tablet (0.125 mg total) under the tongue every 4 (four) hours as needed. 06/23/23  Yes Ula Prentice SAUNDERS, MD  levETIRAcetam (KEPPRA) 500 MG tablet Take 1,000 mg by mouth 2 (two) times daily. 07/10/22  Yes [provider]  ondansetron  (ZOFRAN ) 4 MG tablet Take 1 tablet (4 mg total) by mouth every 6 (six) hours. 06/23/23  Yes Ula Prentice SAUNDERS, MD  ondansetron  (ZOFRAN -ODT) 8 MG disintegrating tablet Take 1 tablet by  mouth every 8 (eight) hours as needed. 07/15/22  Yes [provider]  Bioflavonoid Products (VITAMIN C) CHEW Chew 2 tablets by mouth daily.    [provider]  dicyclomine  (BENTYL ) 20 MG tablet Take 1 tablet (20 mg total) by mouth 2 (two) times daily. 04/28/19   Garrick Charleston, MD  hydrOXYzine (ATARAX) 50 MG tablet Take 50 mg by mouth daily as needed.    [provider]  hydrOXYzine (VISTARIL) 25 MG capsule Take 25-50 mg by mouth daily as needed for anxiety.  11/30/18   [provider]  ibuprofen  (ADVIL ,MOTRIN ) 800 MG tablet Take 1 tablet (800 mg total) by mouth every 8 (eight) hours as needed. Patient taking differently: Take 800 mg by mouth daily.  07/23/18   Marne Kelly Nest, MD  LILLOW 0.15-30 MG-MCG tablet Take 1 tablet by mouth daily. 03/02/19   [provider]  promethazine (PHENERGAN) 25 MG tablet Take 25 mg by mouth every 8 (eight) hours as needed for nausea.    [provider]  sertraline (ZOLOFT) 100 MG tablet Take 150 mg by mouth at bedtime. Pt takes one and one half tablets to = 150 mg    [provider]  traZODone (DESYREL) 50 MG tablet Take 50 mg by mouth at bedtime.    [provider]      Allergies    Patient has no known allergies.    Review of Systems   Review of Systems  Gastrointestinal:  Positive for abdominal pain and nausea.  All other systems reviewed and are negative.   Physical Exam Updated Vital Signs BP (!) 135/96 (BP Location: Right Arm)   Pulse 69   Temp 98.6 F (37 C) (Oral)   Resp 17   Ht 5' 7 (1.702 m)   Wt 65.8 kg   SpO2 99%   BMI 22.71 kg/m  Physical Exam Vitals and nursing note reviewed.   Gen: NAD Eyes: PERRL, EOMI HEENT: no oropharyngeal swelling Neck: trachea midline Resp: clear to auscultation bilaterally Card: RRR, no murmurs, rubs, or gallops Abd: The patient is tender over the right periumbilical region as well as the right lower quadrant with no guarding  or rebound Extremities: no calf tenderness, no edema Vascular: 2+ radial pulses bilaterally, 2+ DP pulses bilaterally Skin: no rashes Psyc: acting appropriately   ED Results / Procedures / Treatments   Labs (all labs ordered are listed, but only abnormal results are displayed) Labs Reviewed  COMPREHENSIVE METABOLIC PANEL - Abnormal; Notable for the following components:      Result Value   AST 12 (*)    Alkaline Phosphatase 34 (*)    All other components within normal limits  URINALYSIS, ROUTINE W REFLEX MICROSCOPIC - Abnormal; Notable for the following components:   Specific Gravity, Urine 1.034 (*)    Ketones, ur TRACE (*)    Protein, ur TRACE (*)    Leukocytes,Ua SMALL (*)    All other components within normal limits  CBC WITH DIFFERENTIAL/PLATELET  LIPASE, BLOOD  PREGNANCY, URINE    EKG None  Radiology CT ABDOMEN PELVIS W CONTRAST Result Date: 06/23/2023 CLINICAL DATA:  Right lower quadrant abdominal pain. Nausea. Recent diagnosis of diverticulitis. EXAM: CT ABDOMEN AND PELVIS WITH CONTRAST TECHNIQUE: Multidetector CT imaging of the abdomen and pelvis was performed using the standard protocol following bolus administration of intravenous contrast. RADIATION DOSE REDUCTION: This exam was performed according to the departmental dose-optimization program which includes automated exposure control, adjustment of the mA and/or kV according to patient size and/or use of iterative reconstruction technique. CONTRAST:  80mL OMNIPAQUE  IOHEXOL  300 MG/ML  SOLN COMPARISON:  CT abdomen pelvis dated 04/28/2019. FINDINGS: Lower chest: The visualized lung bases are clear. No intra-abdominal free air.  Trace free fluid within the pelvis. Hepatobiliary: No focal liver abnormality is seen. No gallstones, gallbladder wall thickening, or biliary dilatation. Pancreas: Unremarkable. No pancreatic ductal dilatation or surrounding inflammatory changes. Spleen: Normal in size without focal abnormality.  Adrenals/Urinary Tract: The adrenal glands unremarkable. There is no hydronephrosis on either side. The visualized ureters appear unremarkable. The urinary bladder is collapsed. Stomach/Bowel: There is moderate stool throughout the colon. There is no bowel obstruction or active inflammation. The appendix is poorly visualized due to location. The visualized appendix however appears unremarkable. Vascular/Lymphatic: The abdominal aorta and IVC are unremarkable. No portal venous gas. There is no adenopathy. Reproductive: The uterus is anteverted and grossly unremarkable. No adnexal masses. Other: None Musculoskeletal: No acute or significant osseous findings. IMPRESSION: No acute intra-abdominal or pelvic pathology. Electronically Signed   By: Vanetta Chou M.D.   On: 06/23/2023 17:38    Procedures Procedures    Medications Ordered in ED Medications  morphine  (PF) 2 MG/ML injection 2 mg (2 mg Intravenous Given 06/23/23 1556)  ondansetron  (ZOFRAN ) injection 4 mg (4 mg Intravenous Given 06/23/23 1556)  iohexol  (OMNIPAQUE ) 300 MG/ML solution 100 mL (80 mLs Intravenous Contrast Given 06/23/23 1721)    ED Course/ Medical Decision Making/ A&P  Medical Decision Making 23 year old female with no reported past medical history presenting to the emergency department today with abdominal pain, nausea, vomiting, and diarrhea over the past week.  I will further evaluate the patient here with basic labs including LFTs and a lipase to evaluate for hepatobiliary pathology or pancreatitis.  Will obtain a CT scan of her abdomen to evaluate for appendicitis, cholecystitis, Coley lithiasis, diverticulitis, colitis, or other intra-abdominal pathology.  I will give patient morphine  and Zofran  for symptoms.  Will reevaluate for ultimate disposition.  Given her age IBD is also on the differential.  The patient is alert appears reassuring.  Urinalysis does show some leukocytes bilateral 11-20  epithelial cells I suspect this is likely a contaminant.  CT scan does not show any concerning findings.  She will be discharged with GI follow-up with return precautions.  Amount and/or Complexity of Data Reviewed Labs: ordered. Radiology: ordered.  Risk Prescription drug management.           Final Clinical Impression(s) / ED Diagnoses Final diagnoses:  Abdominal pain, unspecified abdominal location    Rx / DC Orders ED Discharge Orders          Ordered    hyoscyamine  (LEVSIN /SL) 0.125 MG SL tablet  Every 4 hours PRN        06/23/23 1743    ondansetron  (ZOFRAN ) 4 MG tablet  Every 6 hours        06/23/23 1744              Ula Prentice SAUNDERS, MD 06/23/23 1744

## 2023-06-24 ENCOUNTER — Other Ambulatory Visit (HOSPITAL_BASED_OUTPATIENT_CLINIC_OR_DEPARTMENT_OTHER): Payer: Self-pay

## 2023-06-26 ENCOUNTER — Other Ambulatory Visit (INDEPENDENT_AMBULATORY_CARE_PROVIDER_SITE_OTHER): Payer: BC Managed Care – PPO

## 2023-06-26 ENCOUNTER — Ambulatory Visit (INDEPENDENT_AMBULATORY_CARE_PROVIDER_SITE_OTHER): Payer: BC Managed Care – PPO | Admitting: Internal Medicine

## 2023-06-26 ENCOUNTER — Ambulatory Visit: Payer: BC Managed Care – PPO | Admitting: Internal Medicine

## 2023-06-26 ENCOUNTER — Encounter: Payer: Self-pay | Admitting: Internal Medicine

## 2023-06-26 VITALS — HR 74 | Ht 67.0 in | Wt 150.0 lb

## 2023-06-26 DIAGNOSIS — R1011 Right upper quadrant pain: Secondary | ICD-10-CM | POA: Diagnosis not present

## 2023-06-26 DIAGNOSIS — R110 Nausea: Secondary | ICD-10-CM | POA: Diagnosis not present

## 2023-06-26 DIAGNOSIS — K5900 Constipation, unspecified: Secondary | ICD-10-CM

## 2023-06-26 LAB — C-REACTIVE PROTEIN: CRP: <1 mg/dL (ref 0.5–20.0)

## 2023-06-26 LAB — TSH: TSH: 1.54 u[IU]/mL (ref 0.35–5.50)

## 2023-06-26 MED ORDER — ONDANSETRON HCL 4 MG PO TABS: 4.0000 mg | ORAL_TABLET | Freq: Four times a day (QID) | ORAL | 0 refills | Status: AC

## 2023-06-26 NOTE — Patient Instructions (Signed)
 Your provider has requested that you go to the basement level for lab work before leaving today. Press B on the elevator. The lab is located at the first door on the left as you exit the elevator.  You have been scheduled for an abdominal ultrasound at [facility] (1st floor of hospital) on 06/30/23 at 7 am. Please arrive 30 minutes prior to your appointment for registration. Make certain not to have anything to eat or drink 6 hours prior to your appointment. Should you need to reschedule your appointment, please contact radiology at [phone]. This test typically takes about 30 minutes to perform.   We have sent the following medications to your pharmacy for you to pick up at your convenience: Zofran   Drink 8 cups of water a day and walk 30 minutes a day.  Please purchase the following medications over the counter and take as directed: Fiber supplement such as Benefiber- use as directed daily Miralax: Take as  directed up to 3 times a day to achieve regular bowel movements  _______________________________________________________  If your blood pressure at your visit was 140/90 or greater, please contact your primary care physician to follow up on this.  _______________________________________________________  If you are age 50 or older, your body mass index should be between 23-30. Your Body mass index is 23.49 kg/m. If this is out of the aforementioned range listed, please consider follow up with your Primary Care Provider.  If you are age 71 or younger, your body mass index should be between 19-25. Your Body mass index is 23.49 kg/m. If this is out of the aformentioned range listed, please consider follow up with your Primary Care Provider.   ________________________________________________________  The [facility] providers would like to encourage you to use MYCHART to communicate with providers for non-urgent requests or questions.  Due to long hold times on the  telephone, sending your provider a message by [facility] may be a faster and more efficient way to get a response.  Please allow 48 business hours for a response.  Please remember that this is for non-urgent requests.  _______________________________________________________  Due to recent changes in healthcare laws, you may see the results of your imaging and laboratory studies on MyChart before your provider has had a chance to review them.  We understand that in some cases there may be results that are confusing or concerning to you. Not all laboratory results come back in the same time frame and the provider may be waiting for multiple results in order to interpret others.  Please give us  48 hours in order for your provider to thoroughly review all the results before contacting the office for clarification of your results.   Thank you for entrusting me with your care and for choosing [facility], Dr. Estefana Kidney

## 2023-06-26 NOTE — Progress Notes (Signed)
 Chief Complaint: Abdominal pain  HPI : 23 year old female with history of anorexia in remission and ovarian cyst presents with abdominal pain  Patient presented to the ED on 1/7 for abdominal pain, nausea, and lightheadedness. Patient's CT scan and labs were relatively reassuring at that time.  Patient states that her symptoms initially started about a week and a half ago.  She first developed lightheadedness, nausea, diarrhea, and right sided abdominal pain.  Denies overt vomiting.  Her diarrhea was interesting that it was only small amounts of stool coming out.  She initially tried to eat and drink some liquid IV but her symptoms persisted.  She went to urgent care and was told that she may have norovirus.  However her symptoms did not improve and then she was subsequently labeled with a diagnosis of diverticulitis (without any imaging) for which she is currently still on Augmentin therapy.  She does not think that the Augmentin has helped significantly with her symptoms.  About a week ago, she started experiencing poor appetite, fatigue, and constipation.  She was sleeping 12 hours/day at times.  Heating pad would help with her pain.  She adjusted her diet, but this did not seem to help with her pain significantly.  Denies NSAID use.  Denies blood in the stools recently.  She does still feel constipated currently, producing mostly only small amounts of stool.  Urine seems to take longer to come out.  Endorses rectal pain only when straining.  Denies chest burning, regurgitation, or dysphagia.  She does think she has lost some weight as a result of her recent GI symptoms.  Patient is using Zofran  as needed.  She did see an OB/GYN, and her ovarian cyst was ruled out as a etiology for her abdominal pain.  Patient has family history of ovarian cyst, diverticulitis, and gallbladder issues.  Father/sister/mother all have major bowel problems without a clear diagnosis.  Maternal grandmother had colon  cancer.  Past Medical History:  Diagnosis Date   Anorexia nervosa in remission    Ovarian cyst    Past Surgical History:  Procedure Laterality Date   LAPAROSCOPIC OVARIAN CYSTECTOMY Right 07/23/2018   Procedure: LAPAROSCOPIC OVARIAN CYSTECTOMY;  Surgeon: Marne Kelly Nest, MD;  Location: [facility];  Service: Gynecology;  Laterality: Right;   TONSILLECTOMY AND ADENOIDECTOMY  2008   Family History  Problem Relation Age of Onset   Colon cancer Maternal Grandmother    Liver disease Neg Hx    Esophageal cancer Neg Hx    Social History   Tobacco Use   Smoking status: Never   Smokeless tobacco: Never  Vaping Use   Vaping status: Never Used  Substance Use Topics   Alcohol use: Yes    Comment: occasional   Drug use: Never   Current Outpatient Medications  Medication Sig Dispense Refill   amoxicillin-clavulanate (AUGMENTIN) 875-125 MG tablet Take 1 tablet by mouth 3 (three) times daily.     hydrOXYzine (ATARAX) 50 MG tablet Take 50 mg by mouth daily as needed.     hydrOXYzine (VISTARIL) 25 MG capsule Take 25-50 mg by mouth daily as needed for anxiety.      hyoscyamine  (LEVSIN /SL) 0.125 MG SL tablet Place 1 tablet (0.125 mg total) under the tongue every 4 (four) hours as needed. 30 tablet 0   ibuprofen  (ADVIL ,MOTRIN ) 800 MG tablet Take 1 tablet (800 mg total) by mouth every 8 (eight) hours as needed. (Patient taking differently: Take 800 mg by mouth daily.) 30 tablet  0   levETIRAcetam (KEPPRA) 500 MG tablet Take 1,000 mg by mouth 2 (two) times daily.     LILLOW 0.15-30 MG-MCG tablet Take 1 tablet by mouth daily.     ondansetron  (ZOFRAN -ODT) 8 MG disintegrating tablet Take 1 tablet by mouth every 8 (eight) hours as needed.     promethazine (PHENERGAN) 25 MG tablet Take 25 mg by mouth every 8 (eight) hours as needed for nausea.     sertraline (ZOLOFT) 100 MG tablet Take 150 mg by mouth at bedtime. Pt takes one and one half tablets to = 150 mg     ondansetron  (ZOFRAN )  4 MG tablet Take 1 tablet (4 mg total) by mouth every 6 (six) hours. 12 tablet 0   No current facility-administered medications for this visit.   No Known Allergies  Review of Systems: All systems reviewed and negative except where noted in HPI.   Physical Exam: Pulse 74   Ht 5' 7 (1.702 m)   Wt 150 lb (68 kg)   BMI 23.49 kg/m  Constitutional: Pleasant,well-developed, female in no acute distress. HEENT: Normocephalic and atraumatic. Conjunctivae are normal. No scleral icterus. Cardiovascular: Normal rate, regular rhythm.  Pulmonary/chest: Effort normal and breath sounds normal. No wheezing, rales or rhonchi. Abdominal: Soft, nondistended, tender in the RUQ ab pain (over the right lower ribs and under the right lower ribs as well). Bowel sounds active throughout. There are no masses palpable. No hepatomegaly. Extremities: No edema Neurological: Alert and oriented to person place and time. Skin: Skin is warm and dry. No rashes noted. Psychiatric: Normal mood and affect. Behavior is normal.  Labs 06/2023: CBC normal.  CMP normal.  Lipase normal.  CT A/P w/contrast 06/23/23: FINDINGS: Moderate stool throughout the colon. IMPRESSION: No acute intra-abdominal or pelvic pathology.  ASSESSMENT AND PLAN: RUQ abdominal pain Constipation Nausea Patient presents with right upper quadrant abdominal pain, nausea, and fatigue over the last 1.5 weeks.  Her recent labs and CT scan were only notable for some moderate stool throughout the colon.  Constipation could be a source of her abdominal pain and nausea.  Will plan for further evaluation with labs and right upper quadrant ultrasound.  Will also have the patient's start treatment for constipation with daily MiraLAX. - Check TTG IgA, IgA, TSH, CRP - Drink 8 cups of water per day, walk 30 min per day, daily fiber supplement - Start daily Miralax.  If once daily is not effective, then recommend increasing to twice daily.  If this is still not  sufficient for helping her constipation, we could consider prescription constipation therapies in the future. - Will refill Zofran  - RUQ U/S - RTC in 2 months  Estefana Kidney, MD  I spent 61 minutes of time, including in depth chart review, independent review of results as outlined above, communicating results with the patient directly, face-to-face time with the patient, coordinating care, ordering studies and medications as appropriate, and documentation.

## 2023-06-28 LAB — TISSUE TRANSGLUTAMINASE, IGA: (tTG) Ab, IgA: <1 U/mL

## 2023-06-28 LAB — IGA: Immunoglobulin A: 75 mg/dL (ref 47–310)

## 2023-06-29 ENCOUNTER — Encounter: Payer: Self-pay | Admitting: Internal Medicine

## 2023-06-30 ENCOUNTER — Ambulatory Visit (HOSPITAL_COMMUNITY): Admission: RE | Admit: 2023-06-30 | Payer: BC Managed Care – PPO | Source: Ambulatory Visit

## 2023-06-30 ENCOUNTER — Ambulatory Visit (HOSPITAL_COMMUNITY)
Admission: RE | Admit: 2023-06-30 | Discharge: 2023-06-30 | Disposition: A | Discharge status: Home / Self Care | Payer: BC Managed Care – PPO | Source: Ambulatory Visit | Attending: Internal Medicine | Admitting: Internal Medicine

## 2023-06-30 DIAGNOSIS — R110 Nausea: Secondary | ICD-10-CM | POA: Diagnosis present

## 2023-06-30 DIAGNOSIS — K5900 Constipation, unspecified: Secondary | ICD-10-CM | POA: Diagnosis present

## 2023-06-30 DIAGNOSIS — R1011 Right upper quadrant pain: Secondary | ICD-10-CM | POA: Diagnosis present

## 2023-07-10 ENCOUNTER — Ambulatory Visit (HOSPITAL_COMMUNITY): Payer: BC Managed Care – PPO

## 2023-07-15 ENCOUNTER — Other Ambulatory Visit: Payer: Self-pay

## 2023-07-15 DIAGNOSIS — R1011 Right upper quadrant pain: Secondary | ICD-10-CM

## 2023-07-15 DIAGNOSIS — R110 Nausea: Secondary | ICD-10-CM

## 2023-07-16 ENCOUNTER — Other Ambulatory Visit: Payer: Self-pay

## 2023-07-16 DIAGNOSIS — R1310 Dysphagia, unspecified: Secondary | ICD-10-CM

## 2023-07-16 DIAGNOSIS — R1031 Right lower quadrant pain: Secondary | ICD-10-CM

## 2023-07-16 DIAGNOSIS — K5900 Constipation, unspecified: Secondary | ICD-10-CM

## 2023-07-16 DIAGNOSIS — R1030 Lower abdominal pain, unspecified: Secondary | ICD-10-CM

## 2023-07-16 DIAGNOSIS — R1011 Right upper quadrant pain: Secondary | ICD-10-CM

## 2023-07-16 DIAGNOSIS — K219 Gastro-esophageal reflux disease without esophagitis: Secondary | ICD-10-CM

## 2023-07-20 ENCOUNTER — Encounter: Payer: Self-pay | Admitting: Internal Medicine

## 2023-07-21 ENCOUNTER — Ambulatory Visit (HOSPITAL_COMMUNITY)
Admission: RE | Admit: 2023-07-21 | Discharge: 2023-07-21 | Disposition: A | Discharge status: Home / Self Care | Payer: BC Managed Care – PPO | Source: Ambulatory Visit | Attending: Internal Medicine | Admitting: Internal Medicine

## 2023-07-21 DIAGNOSIS — R1030 Lower abdominal pain, unspecified: Secondary | ICD-10-CM | POA: Diagnosis present

## 2023-07-21 DIAGNOSIS — K5900 Constipation, unspecified: Secondary | ICD-10-CM | POA: Diagnosis present

## 2023-07-21 DIAGNOSIS — R1011 Right upper quadrant pain: Secondary | ICD-10-CM | POA: Insufficient documentation

## 2023-07-21 DIAGNOSIS — R1031 Right lower quadrant pain: Secondary | ICD-10-CM | POA: Insufficient documentation

## 2023-07-21 MED ORDER — IOHEXOL 300 MG/ML  SOLN
100.0000 mL | Freq: Once | INTRAMUSCULAR | Status: AC | PRN
  Administered 2023-07-21: 100 mL via INTRAVENOUS

## 2023-07-27 ENCOUNTER — Encounter: Payer: BC Managed Care – PPO | Admitting: Internal Medicine

## 2023-07-27 ENCOUNTER — Encounter: Payer: Self-pay | Admitting: Internal Medicine

## 2023-07-28 ENCOUNTER — Ambulatory Visit (HOSPITAL_COMMUNITY)
Admission: RE | Admit: 2023-07-28 | Discharge: 2023-07-28 | Disposition: A | Discharge status: Home / Self Care | Payer: BC Managed Care – PPO | Source: Ambulatory Visit | Attending: Internal Medicine | Admitting: Internal Medicine

## 2023-07-28 ENCOUNTER — Encounter: Payer: Self-pay | Admitting: Internal Medicine

## 2023-07-28 DIAGNOSIS — R110 Nausea: Secondary | ICD-10-CM | POA: Insufficient documentation

## 2023-07-28 DIAGNOSIS — R1011 Right upper quadrant pain: Secondary | ICD-10-CM | POA: Diagnosis present

## 2023-07-28 MED ORDER — TECHNETIUM TC 99M MEBROFENIN IV KIT
5.3000 | PACK | Freq: Once | INTRAVENOUS | Status: AC | PRN
  Administered 2023-07-28: 5.3 via INTRAVENOUS

## 2023-08-19 ENCOUNTER — Telehealth: Payer: Self-pay | Admitting: Internal Medicine

## 2023-08-19 NOTE — Telephone Encounter (Signed)
 Patient called and stated that she is wanting to know if she can get a colonoscopy procedure added to her EGD due to her tracking her bowel movents and having abdominal pain. Patient also stated that when she goes to the bathroom to make a bowel movement she is not emptying out completely and she had increased fiber and water intake as well as added exercise to her routine,but is not seeing any results. Patient as well stated that she would like her follow up appointment to be done virtually due to her having scheduling conflict with her job. Patient is requesting a call back. Please advise.

## 2023-08-19 NOTE — Telephone Encounter (Signed)
 EGD is 08/26/22  Dr Leonides Schanz is out of the office this week. If colon is approved based on what the patient is reporting, the procedure date will need to be changed due to scheduling, need to prep, and insurance authorization.  Patient's follow up appointment is 08/31/23. We can change this to a virtual visit after approval from Dr Leonides Schanz.  Called the patient to discuss. No answer. Voicemail is full.

## 2023-08-20 NOTE — Telephone Encounter (Signed)
 Called the patient to discuss. No answer. Voicemail is full.

## 2023-08-26 ENCOUNTER — Encounter: Payer: Self-pay | Admitting: Internal Medicine

## 2023-08-26 ENCOUNTER — Ambulatory Visit: Payer: BC Managed Care – PPO | Admitting: Internal Medicine

## 2023-08-26 VITALS — BP 126/96 | HR 63 | Temp 98.2°F | Resp 12 | Ht 67.0 in | Wt 150.0 lb

## 2023-08-26 DIAGNOSIS — R1310 Dysphagia, unspecified: Secondary | ICD-10-CM

## 2023-08-26 DIAGNOSIS — R1011 Right upper quadrant pain: Secondary | ICD-10-CM

## 2023-08-26 DIAGNOSIS — K3189 Other diseases of stomach and duodenum: Secondary | ICD-10-CM | POA: Diagnosis not present

## 2023-08-26 DIAGNOSIS — K219 Gastro-esophageal reflux disease without esophagitis: Secondary | ICD-10-CM

## 2023-08-26 DIAGNOSIS — K319 Disease of stomach and duodenum, unspecified: Secondary | ICD-10-CM | POA: Diagnosis not present

## 2023-08-26 DIAGNOSIS — K2970 Gastritis, unspecified, without bleeding: Secondary | ICD-10-CM | POA: Diagnosis present

## 2023-08-26 DIAGNOSIS — K5900 Constipation, unspecified: Secondary | ICD-10-CM

## 2023-08-26 MED ORDER — PANTOPRAZOLE SODIUM 40 MG PO TBEC: 40.0000 mg | DELAYED_RELEASE_TABLET | Freq: Every day | ORAL | 3 refills | Status: AC

## 2023-08-26 MED ORDER — SODIUM CHLORIDE 0.9 % IV SOLN
500.0000 mL | Freq: Once | INTRAVENOUS | Status: DC
Start: 2023-08-26 — End: 2023-08-26

## 2023-08-26 MED ORDER — LINACLOTIDE 72 MCG PO CAPS: 72.0000 ug | ORAL_CAPSULE | Freq: Every day | ORAL | 2 refills | Status: AC

## 2023-08-26 NOTE — Op Note (Signed)
 [facility] Patient Name: Alexis Keller Procedure Date: 08/26/2023 9:48 AM MRN: [id number] Endoscopist: Particia Lather , , [id number] Age: 23 Referring MD:  Date of Birth: Jun 24, 2000 Gender: Female Account #: [id number] Procedure:                Upper GI endoscopy Indications:              Abdominal pain in the right upper quadrant,                            Dysphagia Medicines:                Monitored Anesthesia Care Procedure:                Pre-Anesthesia Assessment:                           - Prior to the procedure, a History and Physical                            was performed, and patient medications and                            allergies were reviewed. The patient's tolerance of                            previous anesthesia was also reviewed. The risks                            and benefits of the procedure and the sedation                            options and risks were discussed with the patient.                            All questions were answered, and informed consent                            was obtained. Prior Anticoagulants: The patient has                            taken no anticoagulant or antiplatelet agents. ASA                            Grade Assessment: II - A patient with mild systemic                            disease. After reviewing the risks and benefits,                            the patient was deemed in satisfactory condition to                            undergo the procedure.  After obtaining informed consent, the endoscope was                            passed under direct vision. Throughout the                            procedure, the patient's blood pressure, pulse, and                            oxygen saturations were monitored continuously. The                            GIF [id number] #[id number] was introduced through the                            mouth, and advanced to the second part of  duodenum.                            The upper GI endoscopy was accomplished without                            difficulty. The patient tolerated the procedure                            well. Scope In: Scope Out: Findings:                 The examined esophagus was normal. Biopsies were                            taken with a cold forceps for histology.                           Localized mild inflammation characterized by                            congestion (edema), erosions and erythema was found                            in the gastric antrum. Biopsies were taken with a                            cold forceps for histology.                           The examined duodenum was normal. Complications:            No immediate complications. Estimated Blood Loss:     Estimated blood loss was minimal. Impression:               - Normal esophagus. Biopsied.                           - Gastritis. Biopsied.                           -  Normal examined duodenum. Recommendation:           - Discharge patient to home (with escort).                           - Await pathology results.                           - Start pantoprazole 40 mg QD to help with                            gastritis.                           - Okay to proceed to proceed with scheduling a                            colonoscopy in the [facility] for change in bowel habits.                           - The findings and recommendations were discussed                            with the patient. Dr Particia Lather "Stearns" Triplett,  08/26/2023 10:28:10 AM

## 2023-08-26 NOTE — Progress Notes (Signed)
 Pt's states no medical or surgical changes since previsit or office visit.

## 2023-08-26 NOTE — Patient Instructions (Signed)
 Discharge instructions given. Handout on Gastritis. Prescription sent to pharmacy. Scheduler will call to schedule colonoscopy. Resume previous medications. YOU HAD AN ENDOSCOPIC PROCEDURE TODAY AT THE [facility]:   Refer to the procedure report that was given to you for any specific questions about what was found during the examination.  If the procedure report does not answer your questions, please call your gastroenterologist to clarify.  If you requested that your care partner not be given the details of your procedure findings, then the procedure report has been included in a sealed envelope for you to review at your convenience later.  YOU SHOULD EXPECT: Some feelings of bloating in the abdomen. Passage of more gas than usual.  Walking can help get rid of the air that was put into your GI tract during the procedure and reduce the bloating. If you had a lower endoscopy (such as a colonoscopy or flexible sigmoidoscopy) you may notice spotting of blood in your stool or on the toilet paper. If you underwent a bowel prep for your procedure, you may not have a normal bowel movement for a few days.  Please Note:  You might notice some irritation and congestion in your nose or some drainage.  This is from the oxygen used during your procedure.  There is no need for concern and it should clear up in a day or so.  SYMPTOMS TO REPORT IMMEDIATELY:   Following upper endoscopy (EGD)  Vomiting of blood or coffee ground material  New chest pain or pain under the shoulder blades  Painful or persistently difficult swallowing  New shortness of breath  Fever of 100F or higher  Black, tarry-looking stools  For urgent or emergent issues, a gastroenterologist can be reached at any hour by calling [phone]. Do not use MyChart messaging for urgent concerns.    DIET:  We do recommend a small meal at first, but then you may proceed to your regular diet.  Drink plenty of fluids but you  should avoid alcoholic beverages for 24 hours.  ACTIVITY:  You should plan to take it easy for the rest of today and you should NOT DRIVE or use heavy machinery until tomorrow (because of the sedation medicines used during the test).    FOLLOW UP: Our staff will call the number listed on your records the next business day following your procedure.  We will call around 7:15- 8:00 am to check on you and address any questions or concerns that you may have regarding the information given to you following your procedure. If we do not reach you, we will leave a message.     If any biopsies were taken you will be contacted by phone or by letter within the next 1-3 weeks.  Please call us at [phone] if you have not heard about the biopsies in 3 weeks.    SIGNATURES/CONFIDENTIALITY: You and/or your care partner have signed paperwork which will be entered into your electronic medical record.  These signatures attest to the fact that that the information above on your After Visit Summary has been reviewed and is understood.  Full responsibility of the confidentiality of this discharge information lies with you and/or your care-partner.

## 2023-08-26 NOTE — Progress Notes (Signed)
 Pt A/O x 3, gd SR's, pleased with anesthesia, report to RN

## 2023-08-26 NOTE — Progress Notes (Signed)
 GASTROENTEROLOGY PROCEDURE H&P NOTE   Primary Care Physician: Shelba Flake, MD    Reason for Procedure:   RUQ ab pain, dysphagia, nausea  Plan:    EGD  Patient is appropriate for endoscopic procedure(s) in the ambulatory ([facility]) setting.  The nature of the procedure, as well as the risks, benefits, and alternatives were carefully and thoroughly reviewed with the patient. Ample time for discussion and questions allowed. The patient understood, was satisfied, and agreed to proceed.     HPI: Alexis Keller is a 23 y.o. female who presents for EGD for evaluation of RUQ ab pain, dysphagia, nausea.  Patient was most recently seen in the Gastroenterology Clinic on 06/26/23.  Since that clinic visit, her abdominal pain has improved but her bowel habits are still very irregular. Please refer to that note for full details regarding GI history and clinical presentation.   Past Medical History:  Diagnosis Date   Anorexia nervosa in remission    Ovarian cyst     Past Surgical History:  Procedure Laterality Date   LAPAROSCOPIC OVARIAN CYSTECTOMY Right 07/23/2018   Procedure: LAPAROSCOPIC OVARIAN CYSTECTOMY;  Surgeon: Ranae Pila, MD;  Location: [facility];  Service: Gynecology;  Laterality: Right;   TONSILLECTOMY AND ADENOIDECTOMY  2008    Prior to Admission medications   Medication Sig Start Date End Date Taking? Authorizing Provider  hydrOXYzine (ATARAX) 50 MG tablet Take 50 mg by mouth daily as needed.    [provider]  hydrOXYzine (VISTARIL) 25 MG capsule Take 25-50 mg by mouth daily as needed for anxiety.  11/30/18   [provider]  hyoscyamine (LEVSIN/SL) 0.125 MG SL tablet Place 1 tablet (0.125 mg total) under the tongue every 4 (four) hours as needed. 06/23/23   Durwin Glaze, MD  ibuprofen (ADVIL,MOTRIN) 800 MG tablet Take 1 tablet (800 mg total) by mouth every 8 (eight) hours as needed. Patient taking differently: Take 800 mg by  mouth daily. 07/23/18   Ranae Pila, MD  levETIRAcetam (KEPPRA) 500 MG tablet Take 1,000 mg by mouth 2 (two) times daily. 07/10/22   [provider]  LILLOW 0.15-30 MG-MCG tablet Take 1 tablet by mouth daily. 03/02/19   [provider]  ondansetron (ZOFRAN) 4 MG tablet Take 1 tablet (4 mg total) by mouth every 6 (six) hours. 06/26/23   Imogene Burn, MD  ondansetron (ZOFRAN-ODT) 8 MG disintegrating tablet Take 1 tablet by mouth every 8 (eight) hours as needed. 07/15/22   [provider]  promethazine (PHENERGAN) 25 MG tablet Take 25 mg by mouth every 8 (eight) hours as needed for nausea.    [provider]  sertraline (ZOLOFT) 100 MG tablet Take 150 mg by mouth at bedtime. Pt takes one and one half tablets to = 150 mg    [provider]    Current Outpatient Medications  Medication Sig Dispense Refill   hydrOXYzine (ATARAX) 50 MG tablet Take 50 mg by mouth daily as needed.     hydrOXYzine (VISTARIL) 25 MG capsule Take 25-50 mg by mouth daily as needed for anxiety.      hyoscyamine (LEVSIN/SL) 0.125 MG SL tablet Place 1 tablet (0.125 mg total) under the tongue every 4 (four) hours as needed. 30 tablet 0   ibuprofen (ADVIL,MOTRIN) 800 MG tablet Take 1 tablet (800 mg total) by mouth every 8 (eight) hours as needed. (Patient taking differently: Take 800 mg by mouth daily.) 30 tablet 0   levETIRAcetam (KEPPRA)  500 MG tablet Take 1,000 mg by mouth 2 (two) times daily.     LILLOW 0.15-30 MG-MCG tablet Take 1 tablet by mouth daily.     ondansetron (ZOFRAN) 4 MG tablet Take 1 tablet (4 mg total) by mouth every 6 (six) hours. 12 tablet 0   ondansetron (ZOFRAN-ODT) 8 MG disintegrating tablet Take 1 tablet by mouth every 8 (eight) hours as needed.     promethazine (PHENERGAN) 25 MG tablet Take 25 mg by mouth every 8 (eight) hours as needed for nausea.     sertraline (ZOLOFT) 100 MG tablet Take 150 mg by mouth at bedtime. Pt takes one and one half tablets to =  150 mg     Current Facility-Administered Medications  Medication Dose Route Frequency Provider Last Rate Last Admin   0.9 %  sodium chloride infusion  500 mL Intravenous Once Imogene Burn, MD        Allergies as of 08/26/2023   (No Known Allergies)    Family History  Problem Relation Age of Onset   Colon cancer Maternal Grandmother    Liver disease Neg Hx    Esophageal cancer Neg Hx     Social History   Socioeconomic History   Marital status: Single    Spouse name: Not on file   Number of children: 0   Years of education: Not on file   Highest education level: Not on file  Occupational History   Occupation: mental health tech  Tobacco Use   Smoking status: Never   Smokeless tobacco: Never  Vaping Use   Vaping status: Never Used  Substance and Sexual Activity   Alcohol use: Yes    Comment: occasional   Drug use: Never   Sexual activity: Not on file  Other Topics Concern   Not on file  Social History Narrative   Not on file   Social Drivers of Health   Financial Resource Strain: Not on file  Food Insecurity: Not on file  Transportation Needs: Not on file  Physical Activity: Not on file  Stress: Not on file  Social Connections: Unknown (07/23/2020)   Received from [facility]   Social Connections    Frequency of Communication with Friends and Family: Not asked    Frequency of Social Gatherings with Friends and Family: Not asked  Intimate Partner Violence: Unknown (07/23/2020)   Received from [facility]   Intimate Partner Violence    Fear of Current or Ex-Partner: Not asked    Emotionally Abused: Not asked    Physically Abused: Not asked    Sexually Abused: Not asked    Physical Exam: Vital signs in last 24 hours: There were no vitals taken for this visit. GEN: NAD EYE: Sclerae anicteric ENT: MMM CV: Non-tachycardic Pulm: No increased WOB GI: Soft NEURO:  Alert & Oriented   Eulah Pont, MD [facility]   08/26/2023 9:13  AM

## 2023-08-26 NOTE — Progress Notes (Signed)
 Called to room to assist during endoscopic procedure.  Patient ID and intended procedure confirmed with present staff. Received instructions for my participation in the procedure from the performing physician.

## 2023-08-27 ENCOUNTER — Telehealth: Payer: Self-pay

## 2023-08-27 ENCOUNTER — Encounter: Payer: Self-pay | Admitting: Internal Medicine

## 2023-08-27 NOTE — Telephone Encounter (Signed)
  Follow up Call-     08/26/2023    9:18 AM  Call back number  Post procedure Call Back phone  # [phone]  Permission to leave phone message Yes     Left message

## 2023-08-28 ENCOUNTER — Encounter: Payer: Self-pay | Admitting: Internal Medicine

## 2023-08-28 LAB — SURGICAL PATHOLOGY

## 2023-08-31 ENCOUNTER — Ambulatory Visit: Payer: BC Managed Care – PPO | Admitting: Internal Medicine

## 2023-09-18 ENCOUNTER — Ambulatory Visit: Admitting: *Deleted

## 2023-09-18 VITALS — Ht 67.0 in | Wt 150.0 lb

## 2023-09-18 DIAGNOSIS — R1011 Right upper quadrant pain: Secondary | ICD-10-CM

## 2023-09-18 DIAGNOSIS — R112 Nausea with vomiting, unspecified: Secondary | ICD-10-CM

## 2023-09-18 DIAGNOSIS — K5900 Constipation, unspecified: Secondary | ICD-10-CM

## 2023-09-18 MED ORDER — SUFLAVE 178.7 G PO SOLR
1.0000 | Freq: Once | ORAL | 0 refills | Status: AC
Start: 2023-09-18 — End: 2023-09-18

## 2023-09-18 MED ORDER — SUFLAVE 178.7 G PO SOLR: 1.0000 | Freq: Once | ORAL | 0 refills | Status: DC

## 2023-09-18 NOTE — Progress Notes (Signed)
 Pt's name and DOB verified at the beginning of the pre-visit wit 2 identifiers  Permission given to speak with  Pt denies any difficulty with ambulating,sitting, laying down or rolling side to side  Pt has no issues with ambulation   Pt has no issues moving head neck or swallowing  No egg or soy allergy known to patient   No issues known to pt with past sedation with any surgeries or procedures  Pt denies having issues being intubated  No FH of Malignant Hyperthermia  Pt is not on diet pills or shots  Pt is not on home 02   Pt is not on blood thinners   Pt denies issues with constipation   Pt has frequent issues with constipation RN instructed pt to use Miralax per bottles instructions a week before prep days. Pt states they will  Pt is not on dialysis  Pt denise any abnormal heart rhythms   Pt denies any upcoming cardiac testing  Patient's chart reviewed by Cathlyn Parsons CNRA prior to pre-visit and patient appropriate for the [facility].  Pre-visit completed and red dot placed by patient's name on their procedure day (on provider's schedule).    Chart not reviewed by CRNA prior to [facility]  Visit by phone  Pt states weight is 150 lb   IInstructions reviewed. Pt given [facility], [facility] main # and MD on call # prior to instructions.  Pt states understanding of instructions. Instructed to review again prior to procedure. Pt states they will.   Informed pt that they will receive a text or  call from [facility] regarding there prep med.

## 2023-09-30 ENCOUNTER — Encounter: Payer: Self-pay | Admitting: Internal Medicine

## 2023-10-01 ENCOUNTER — Encounter: Payer: Self-pay | Admitting: Internal Medicine

## 2023-10-05 ENCOUNTER — Other Ambulatory Visit: Payer: Self-pay | Admitting: Internal Medicine

## 2023-10-05 ENCOUNTER — Telehealth: Payer: Self-pay

## 2023-10-05 ENCOUNTER — Other Ambulatory Visit (INDEPENDENT_AMBULATORY_CARE_PROVIDER_SITE_OTHER)

## 2023-10-05 ENCOUNTER — Encounter: Payer: Self-pay | Admitting: Internal Medicine

## 2023-10-05 ENCOUNTER — Ambulatory Visit (AMBULATORY_SURGERY_CENTER): Admitting: Internal Medicine

## 2023-10-05 VITALS — BP 107/72 | HR 60 | Temp 98.3°F | Resp 12 | Ht 67.0 in | Wt 150.0 lb

## 2023-10-05 DIAGNOSIS — K648 Other hemorrhoids: Secondary | ICD-10-CM

## 2023-10-05 DIAGNOSIS — K5900 Constipation, unspecified: Secondary | ICD-10-CM | POA: Diagnosis not present

## 2023-10-05 DIAGNOSIS — R194 Change in bowel habit: Secondary | ICD-10-CM | POA: Diagnosis not present

## 2023-10-05 LAB — BASIC METABOLIC PANEL WITH GFR
BUN: 12 mg/dL (ref 6–23)
CO2: 25 meq/L (ref 19–32)
Calcium: 9.5 mg/dL (ref 8.4–10.5)
Chloride: 103 meq/L (ref 96–112)
Creatinine, Ser: 0.73 mg/dL (ref 0.40–1.20)
GFR: 114.88 mL/min (ref >60.00)
Glucose, Bld: 82 mg/dL (ref 70–99)
Potassium: 4.5 meq/L (ref 3.5–5.1)
Sodium: 138 meq/L (ref 135–145)

## 2023-10-05 LAB — MAGNESIUM: Magnesium: 1.9 mg/dL (ref 1.5–2.5)

## 2023-10-05 MED ORDER — SODIUM CHLORIDE 0.9 % IV SOLN: 500.0000 mL | Freq: Once | INTRAVENOUS | Status: DC

## 2023-10-05 NOTE — Progress Notes (Signed)
 GASTROENTEROLOGY PROCEDURE H&P NOTE   Primary Care Physician: Pcp, No    Reason for Procedure:   Change in bowel habits  Plan:    Colonoscopy  Patient is appropriate for endoscopic procedure(s) in the ambulatory ([facility]) setting.  The nature of the procedure, as well as the risks, benefits, and alternatives were carefully and thoroughly reviewed with the patient. Ample time for discussion and questions allowed. The patient understood, was satisfied, and agreed to proceed.     HPI: Alexis Keller is a 23 y.o. female who presents for colonoscopy for change in bowel habits.  Past Medical History:  Diagnosis Date   Anorexia nervosa in remission    Anxiety    Depression    GERD (gastroesophageal reflux disease)    Ovarian cyst     Past Surgical History:  Procedure Laterality Date   LAPAROSCOPIC OVARIAN CYSTECTOMY Right 07/23/2018   Procedure: LAPAROSCOPIC OVARIAN CYSTECTOMY;  Surgeon: Concepcion Deck, MD;  Location: [facility];  Service: Gynecology;  Laterality: Right;   TONSILLECTOMY AND ADENOIDECTOMY  2008   UPPER GASTROINTESTINAL ENDOSCOPY      Prior to Admission medications   Medication Sig Start Date End Date Taking? Authorizing Provider  ascorbic acid (VITAMIN C) 500 MG tablet Take 500 mg by mouth daily.   Yes [provider]  Levonorgestrel-Ethinyl Estrad (ALTAVERA PO) Take 1 tablet by mouth daily. 03/09/23  Yes [provider]  sertraline (ZOLOFT) 100 MG tablet Take 150 mg by mouth at bedtime. Pt takes one and one half tablets to = 150 mg   Yes [provider]  traZODone (DESYREL) 50 MG tablet Take 50 mg by mouth at bedtime.   Yes [provider]  hydrOXYzine (VISTARIL) 25 MG capsule Take 25-50 mg by mouth daily as needed for anxiety.  11/30/18   [provider]  ibuprofen  (ADVIL ,MOTRIN ) 800 MG tablet Take 1 tablet (800 mg total) by mouth every 8 (eight) hours as needed. Patient taking differently:  Take 800 mg by mouth daily. 07/23/18   Concepcion Deck, MD  linaclotide  (LINZESS ) 72 MCG capsule Take 1 capsule (72 mcg total) by mouth daily before breakfast. 08/26/23   Daina Drum, MD  ondansetron  (ZOFRAN ) 4 MG tablet Take 1 tablet (4 mg total) by mouth every 6 (six) hours. Patient not taking: Reported on 09/18/2023 06/26/23   Daina Drum, MD  ondansetron  (ZOFRAN -ODT) 8 MG disintegrating tablet Take 1 tablet by mouth every 8 (eight) hours as needed. 07/15/22   [provider]  pantoprazole  (PROTONIX ) 40 MG tablet Take 1 tablet (40 mg total) by mouth daily. 08/26/23   Daina Drum, MD    Current Outpatient Medications  Medication Sig Dispense Refill   ascorbic acid (VITAMIN C) 500 MG tablet Take 500 mg by mouth daily.     Levonorgestrel-Ethinyl Estrad (ALTAVERA PO) Take 1 tablet by mouth daily.     sertraline (ZOLOFT) 100 MG tablet Take 150 mg by mouth at bedtime. Pt takes one and one half tablets to = 150 mg     traZODone (DESYREL) 50 MG tablet Take 50 mg by mouth at bedtime.     hydrOXYzine (VISTARIL) 25 MG capsule Take 25-50 mg by mouth daily as needed for anxiety.      ibuprofen  (ADVIL ,MOTRIN ) 800 MG tablet Take 1 tablet (800 mg total) by mouth every 8 (eight) hours as needed. (Patient taking differently: Take 800 mg by mouth daily.) 30 tablet 0   linaclotide  (LINZESS ) 72 MCG  capsule Take 1 capsule (72 mcg total) by mouth daily before breakfast. 90 capsule 2   ondansetron  (ZOFRAN ) 4 MG tablet Take 1 tablet (4 mg total) by mouth every 6 (six) hours. (Patient not taking: Reported on 09/18/2023) 12 tablet 0   ondansetron  (ZOFRAN -ODT) 8 MG disintegrating tablet Take 1 tablet by mouth every 8 (eight) hours as needed.     pantoprazole  (PROTONIX ) 40 MG tablet Take 1 tablet (40 mg total) by mouth daily. 30 tablet 3   Current Facility-Administered Medications  Medication Dose Route Frequency Provider Last Rate Last Admin   0.9 %  sodium chloride  infusion  500 mL Intravenous Once  Daina Drum, MD        Allergies as of 10/05/2023 - Review Complete 10/05/2023  Allergen Reaction Noted   Grapefruit extract Swelling, Itching, and Rash 07/15/2022    Family History  Problem Relation Age of Onset   Colon cancer Maternal Grandmother    Liver disease Neg Hx    Esophageal cancer Neg Hx    Colon polyps Neg Hx    Rectal cancer Neg Hx    Stomach cancer Neg Hx     Social History   Socioeconomic History   Marital status: Single    Spouse name: Not on file   Number of children: 0   Years of education: Not on file   Highest education level: Not on file  Occupational History   Occupation: mental health tech  Tobacco Use   Smoking status: Never   Smokeless tobacco: Never  Vaping Use   Vaping status: Never Used  Substance and Sexual Activity   Alcohol use: Yes    Comment: occasional   Drug use: Never   Sexual activity: Not on file  Other Topics Concern   Not on file  Social History Narrative   Not on file   Social Drivers of Health   Financial Resource Strain: Not on file  Food Insecurity: Not on file  Transportation Needs: Not on file  Physical Activity: Not on file  Stress: Not on file  Social Connections: Unknown (07/23/2020)   Received from [facility]   Social Connections    Frequency of Communication with Friends and Family: Not asked    Frequency of Social Gatherings with Friends and Family: Not asked  Intimate Partner Violence: Unknown (07/23/2020)   Received from [facility]   Intimate Partner Violence    Fear of Current or Ex-Partner: Not asked    Emotionally Abused: Not asked    Physically Abused: Not asked    Sexually Abused: Not asked    Physical Exam: Vital signs in last 24 hours: BP 113/79   Temp 98.3 F (36.8 C)   Ht 5\' 7"  (1.702 m)   Wt 150 lb (68 kg)   SpO2 98%   BMI 23.49 kg/m  GEN: NAD EYE: Sclerae anicteric ENT: MMM CV: Non-tachycardic Pulm: No increased work of breathing GI: Soft, NT/ND NEURO:  Alert &  Oriented   Regino Caprio, MD [facility]  10/05/2023 8:10 AM

## 2023-10-05 NOTE — Telephone Encounter (Signed)
 Pt made aware of Dr. Rosaline Coma recommendations: Pt stated that [facility] was OK to send referral to since she was already in there system. Referral was sent to [facility] Fax # [phone]: Conformation page received. Pt made aware. Pt notified that if she has not heard anything from there office in 1-2 weeks then to give our office a call.  Pt verbalized understanding with all questions answered.

## 2023-10-05 NOTE — Telephone Encounter (Signed)
-----   Message from Daina Drum sent at 10/05/2023  8:49 AM EDT ----- Hi, could we please get her referred to pelvic floor PT? She lives in [place] so she is hoping to be referred to a facility in that area. If we can find a [facility] or [facility] pelvic floor PT, that would be fine as well. Thanks.

## 2023-10-05 NOTE — Op Note (Addendum)
 [facility] Patient Name: Alexis Keller Procedure Date: 10/05/2023 7:19 AM MRN: [id number] Endoscopist: Pedro Bourgeois , , [id number] Age: 23 Referring MD:  Date of Birth: 08-02-2000 Gender: Female Account #: [id number] Procedure:                Colonoscopy Indications:              Change in bowel habits Medicines:                Monitored Anesthesia Care Procedure:                Pre-Anesthesia Assessment:                           - Prior to the procedure, a History and Physical                            was performed, and patient medications and                            allergies were reviewed. The patient's tolerance of                            previous anesthesia was also reviewed. The risks                            and benefits of the procedure and the sedation                            options and risks were discussed with the patient.                            All questions were answered, and informed consent                            was obtained. Prior Anticoagulants: The patient has                            taken no anticoagulant or antiplatelet agents. ASA                            Grade Assessment: II - A patient with mild systemic                            disease. After reviewing the risks and benefits,                            the patient was deemed in satisfactory condition to                            undergo the procedure.                           After obtaining informed consent, the colonoscope  was passed under direct vision. Throughout the                            procedure, the patient's blood pressure, pulse, and                            oxygen saturations were monitored continuously. The                            [id number]-[id number] Colonoscope [id number] was introduced                            through the anus and advanced to the the terminal                            ileum. The colonoscopy was  performed without                            difficulty. The patient tolerated the procedure                            well. The quality of the bowel preparation was                            excellent. The terminal ileum, ileocecal valve,                            appendiceal orifice, and rectum were photographed. Scope In: 8:14:41 AM Scope Out: 8:30:04 AM Scope Withdrawal Time: 0 hours 8 minutes 51 seconds  Total Procedure Duration: 0 hours 15 minutes 23 seconds  Findings:                 The terminal ileum appeared normal.                           Non-bleeding internal hemorrhoids were found during                            retroflexion.                           Biopsies for histology were taken with a cold                            forceps from the entire colon for evaluation of                            microscopic colitis. Complications:            No immediate complications. Estimated Blood Loss:     Estimated blood loss was minimal. Impression:               - The examined portion of the ileum was normal.                           - Non-bleeding internal hemorrhoids.                           -  Biopsies were taken with a cold forceps from the                            entire colon for evaluation of microscopic colitis. Recommendation:           - Discharge patient to home (with escort).                           - Await pathology results.                           - Will have patient restart Miralax BID, which                            seems to working better than Linzess  72 mcg QD.                            Linzess  has caused irregular stools and increased                            thirst                           - Will check her BMP and magnesium levels due to                            issues with potential neuropathy.                           - Will refer to pelvic floor PT near [facility]                            where she lives.                            - The findings and recommendations were discussed                            with the patient. Dr Pedro Bourgeois "[place]" [place],  10/05/2023 8:36:52 AM

## 2023-10-05 NOTE — Progress Notes (Signed)
 Pt's states no medical or surgical changes since previsit or office visit.

## 2023-10-05 NOTE — Progress Notes (Signed)
 Report to PACU, RN, vss, BBS= Clear.

## 2023-10-05 NOTE — Patient Instructions (Addendum)
 Discharge patient to home (with escort).                           - Await pathology results.                           - Return to GI clinic in 2-3 months.                           - The findings and recommendations were discussed                            with the patient  Patient to call for appointment.        YOU HAD AN ENDOSCOPIC PROCEDURE TODAY AT THE [facility]:   Refer to the procedure report that was given to you for any specific questions about what was found during the examination.  If the procedure report does not answer your questions, please call your gastroenterologist to clarify.  If you requested that your care partner not be given the details of your procedure findings, then the procedure report has been included in a sealed envelope for you to review at your convenience later.  YOU SHOULD EXPECT: Some feelings of bloating in the abdomen. Passage of more gas than usual.  Walking can help get rid of the air that was put into your GI tract during the procedure and reduce the bloating. If you had a lower endoscopy (such as a colonoscopy or flexible sigmoidoscopy) you may notice spotting of blood in your stool or on the toilet paper. If you underwent a bowel prep for your procedure, you may not have a normal bowel movement for a few days.  Please Note:  You might notice some irritation and congestion in your nose or some drainage.  This is from the oxygen used during your procedure.  There is no need for concern and it should clear up in a day or so.  SYMPTOMS TO REPORT IMMEDIATELY:  Following lower endoscopy (colonoscopy or flexible sigmoidoscopy):  Excessive amounts of blood in the stool  Significant tenderness or worsening of abdominal pains  Swelling of the abdomen that is new, acute  Fever of 100F or higher   For urgent or emergent issues, a gastroenterologist can be reached at any hour by calling [phone]. Do not use MyChart messaging for urgent  concerns.    DIET:  We do recommend a small meal at first, but then you may proceed to your regular diet.  Drink plenty of fluids but you should avoid alcoholic beverages for 24 hours.  ACTIVITY:  You should plan to take it easy for the rest of today and you should NOT DRIVE or use heavy machinery until tomorrow (because of the sedation medicines used during the test).    FOLLOW UP: Our staff will call the number listed on your records the next business day following your procedure.  We will call around 7:15- 8:00 am to check on you and address any questions or concerns that you may have regarding the information given to you following your procedure. If we do not reach you, we will leave a message.     If any biopsies were taken you will be contacted by phone or by letter within the next 1-3 weeks.  Please call us  at [phone]  [phone] if you have not heard about the biopsies in 3 weeks.    SIGNATURES/CONFIDENTIALITY: You and/or your care partner have signed paperwork which will be entered into your electronic medical record.  These signatures attest to the fact that that the information above on your After Visit Summary has been reviewed and is understood.  Full responsibility of the confidentiality of this discharge information lies with you and/or your care-partner.

## 2023-10-06 ENCOUNTER — Telehealth: Payer: Self-pay

## 2023-10-06 NOTE — Telephone Encounter (Signed)
 Left message on follow up call.

## 2023-10-07 ENCOUNTER — Encounter: Payer: Self-pay | Admitting: Internal Medicine

## 2023-10-07 LAB — SURGICAL PATHOLOGY
